# Patient Record
Sex: Female | Born: 1960 | Race: White | Hispanic: No | Marital: Married | Smoking: Never smoker
Health system: Southern US, Community
[De-identification: ages and names within clinical notes are randomized; demographics above are authoritative.]

## PROBLEM LIST (undated history)

## (undated) DIAGNOSIS — R42 Dizziness and giddiness: Secondary | ICD-10-CM

## (undated) DIAGNOSIS — G43909 Migraine, unspecified, not intractable, without status migrainosus: Secondary | ICD-10-CM

## (undated) HISTORY — DX: Migraine, unspecified, not intractable, without status migrainosus: G43.909

## (undated) HISTORY — DX: Dizziness and giddiness: R42

## (undated) HISTORY — PX: OTHER SURGICAL HISTORY: SHX169

---

## 1985-01-28 HISTORY — PX: ANTERIOR CRUCIATE LIGAMENT REPAIR: SHX115

## 1998-01-02 ENCOUNTER — Ambulatory Visit (HOSPITAL_COMMUNITY): Admission: RE | Admit: 1998-01-02 | Discharge: 1998-01-02 | Payer: Self-pay | Admitting: Obstetrics and Gynecology

## 1998-01-30 ENCOUNTER — Ambulatory Visit (HOSPITAL_COMMUNITY): Admission: RE | Admit: 1998-01-30 | Discharge: 1998-01-30 | Payer: Self-pay | Admitting: Obstetrics and Gynecology

## 1998-03-16 ENCOUNTER — Ambulatory Visit (HOSPITAL_COMMUNITY): Admission: RE | Admit: 1998-03-16 | Discharge: 1998-03-16 | Payer: Self-pay | Admitting: Obstetrics and Gynecology

## 1998-03-16 ENCOUNTER — Encounter: Payer: Self-pay | Admitting: Obstetrics and Gynecology

## 1998-04-06 ENCOUNTER — Inpatient Hospital Stay (HOSPITAL_COMMUNITY): Admission: AD | Admit: 1998-04-06 | Discharge: 1998-04-08 | Payer: Self-pay | Admitting: Obstetrics and Gynecology

## 1998-04-06 ENCOUNTER — Encounter: Payer: Self-pay | Admitting: Obstetrics and Gynecology

## 1998-11-01 ENCOUNTER — Other Ambulatory Visit: Admission: RE | Admit: 1998-11-01 | Discharge: 1998-11-01 | Payer: Self-pay | Admitting: Gynecology

## 1999-10-03 ENCOUNTER — Other Ambulatory Visit: Admission: RE | Admit: 1999-10-03 | Discharge: 1999-10-03 | Payer: Self-pay | Admitting: Obstetrics and Gynecology

## 2000-04-21 ENCOUNTER — Inpatient Hospital Stay (HOSPITAL_COMMUNITY): Admission: AD | Admit: 2000-04-21 | Discharge: 2000-04-22 | Payer: Self-pay | Admitting: Obstetrics and Gynecology

## 2000-11-14 ENCOUNTER — Other Ambulatory Visit: Admission: RE | Admit: 2000-11-14 | Discharge: 2000-11-14 | Payer: Self-pay | Admitting: Obstetrics and Gynecology

## 2000-12-19 ENCOUNTER — Emergency Department (HOSPITAL_COMMUNITY): Admission: EM | Admit: 2000-12-19 | Discharge: 2000-12-19 | Payer: Self-pay | Admitting: Emergency Medicine

## 2001-10-06 ENCOUNTER — Other Ambulatory Visit: Admission: RE | Admit: 2001-10-06 | Discharge: 2001-10-06 | Payer: Self-pay | Admitting: Obstetrics and Gynecology

## 2002-11-02 ENCOUNTER — Other Ambulatory Visit: Admission: RE | Admit: 2002-11-02 | Discharge: 2002-11-02 | Payer: Self-pay | Admitting: Obstetrics and Gynecology

## 2003-11-30 ENCOUNTER — Other Ambulatory Visit: Admission: RE | Admit: 2003-11-30 | Discharge: 2003-11-30 | Payer: Self-pay | Admitting: Obstetrics and Gynecology

## 2004-12-05 ENCOUNTER — Other Ambulatory Visit: Admission: RE | Admit: 2004-12-05 | Discharge: 2004-12-05 | Payer: Self-pay | Admitting: Obstetrics and Gynecology

## 2010-11-30 ENCOUNTER — Ambulatory Visit (INDEPENDENT_AMBULATORY_CARE_PROVIDER_SITE_OTHER): Payer: 59 | Admitting: Internal Medicine

## 2010-11-30 ENCOUNTER — Institutional Professional Consult (permissible substitution): Payer: Self-pay | Admitting: Internal Medicine

## 2010-11-30 ENCOUNTER — Encounter: Payer: Self-pay | Admitting: Internal Medicine

## 2010-11-30 DIAGNOSIS — J45990 Exercise induced bronchospasm: Secondary | ICD-10-CM

## 2010-11-30 DIAGNOSIS — R059 Cough, unspecified: Secondary | ICD-10-CM

## 2010-11-30 DIAGNOSIS — R05 Cough: Secondary | ICD-10-CM

## 2010-11-30 MED ORDER — TRAMADOL HCL 50 MG PO TABS: 50.0000 mg | ORAL_TABLET | ORAL | Status: AC | PRN

## 2010-11-30 MED ORDER — DOXYCYCLINE HYCLATE 100 MG PO TABS: 100.0000 mg | ORAL_TABLET | Freq: Two times a day (BID) | ORAL | Status: DC

## 2010-11-30 NOTE — Patient Instructions (Addendum)
Try prilosec 20mg   Take 30-60 min before first meal of the day and Pepcid 20 mg one bedtime until cough is completely gone for at least a week without the need for cough suppression  I think of reflux for chronic cough like I do oxygen for fire (doesn't cause the fire but once you get the oxygen suppressed it usually goes away regardless of the exact cause).   GERD (REFLUX)  is an extremely common cause of respiratory symptoms, many times with no significant heartburn at all.    It can be treated with medication, but also with lifestyle changes including avoidance of late meals, excessive alcohol, smoking cessation, and avoid fatty foods, chocolate, peppermint, colas, red wine, and acidic juices such as orange juice.  NO MINT OR MENTHOL PRODUCTS SO NO COUGH DROPS  USE SUGARLESS CANDY INSTEAD (jolley ranchers or Stover's)  NO OIL BASED VITAMINS - use powdered substitutes.   Take delsym two tsp every 12 hours and supplement if needed with  tramadol 50 mg up to 2 every 4 hours to suppress the urge to cough. Swallowing water or using ice chips/non mint and menthol containing candies (such as lifesavers or sugarless jolly ranchers) are also effective.  You should rest your voice and avoid activities that you know make you cough.  Once you have eliminated the cough for 3 straight days try reducing the tramadol first,  then the delsym as tolerated.    Stop flovent but finish up the prednisone   Take doxy x 7 days with large glass of water before eating   Only use proaire for emergencies for cough or sob   If you are satisfied with your treatment plan let your doctor know and he/she can either refill your medications or you can return here when your prescription runs out.     If in any way you are not 100% satisfied,  please tell us.  If 100% better, tell your friends!

## 2010-11-30 NOTE — Progress Notes (Signed)
  Subjective:    Patient ID: Monica Mcgee, female    DOB: [birth year], 50 y.o.   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 Father is md and started her on the keflex and tried her daughter's flovent but not helping   Cough / hoarseness worse at hs but also daytime assoc with minimal nasal congestion and no sob  Sleeping ok without nocturnal  or early am need for noct saba. Also denies any obvious fluctuation of symptoms with weather or environmental changes or other aggravating or alleviating factors except as outlined above    Review of Systems  Constitutional: Positive for fever. Negative for unexpected weight change.  HENT: Positive for sore throat and sneezing. Negative for ear pain, nosebleeds, congestion, rhinorrhea, trouble swallowing, dental problem, postnasal drip and sinus pressure.   Eyes: Negative for redness and itching.  Respiratory: Positive for cough. Negative for chest tightness, shortness of breath and wheezing.   Cardiovascular: Negative for palpitations and leg swelling.  Gastrointestinal: Negative for nausea, vomiting and diarrhea.  Genitourinary: Negative for dysuria.  Musculoskeletal: Negative for joint swelling.  Skin: Negative for rash.  Neurological: Negative for headaches.  Hematological: Does not bruise/bleed easily.  Psychiatric/Behavioral: Negative for dysphoric mood. The patient is not nervous/anxious.        Objective:   Physical Exam  11/30/2010 123  HEENT: nl dentition, turbinates, and orophanx. Nl external ear canals without cough reflex   NECK :   without JVD/Nodes/TM/ nl carotid upstrokes bilaterally   LUNGS: no acc muscle use, clear to A and P bilaterally without cough on insp or exp maneuvers   CV:  RRR  no s3 or murmur or increase in P2, no edema   ABD:  soft and nontender with nl excursion in the supine position. No bruits or organomegaly, bowel sounds nl  MS:  warm without deformities, calf tenderness, cyanosis or clubbing  SKIN: warm and dry without lesions    NEURO:  alert, approp, no deficits         Assessment & Plan:

## 2010-12-01 DIAGNOSIS — R059 Cough, unspecified: Secondary | ICD-10-CM | POA: Insufficient documentation

## 2010-12-01 DIAGNOSIS — R05 Cough: Secondary | ICD-10-CM | POA: Insufficient documentation

## 2010-12-01 DIAGNOSIS — J45990 Exercise induced bronchospasm: Secondary | ICD-10-CM | POA: Insufficient documentation

## 2010-12-01 NOTE — Assessment & Plan Note (Signed)
Very minimal problem and interestingly no active wheeze in setting of uri likely viral, can just use the saba prn for now

## 2010-12-01 NOTE — Assessment & Plan Note (Signed)
Explained natural history of uri and why it's necessary in patients at risk to treat GERD aggressively  at least  short term   to reduce risk of evolving cyclical cough initially  triggered by epithelial injury and a heightened sensitivty to the effects of any upper airway irritants,  most importantly acid - related.  That is, the more sensitive the epithelium damaged for virus, the more the cough, the more the secondary reflux (especially in those prone to reflux) the more the irritation of the sensitive mucosa and so on in a cyclical pattern.  See instructions for specific recommendations which were reviewed directly with the patient who was given a copy with highlighter outlining the key components.  

## 2010-12-12 ENCOUNTER — Telehealth: Payer: Self-pay | Admitting: Internal Medicine

## 2010-12-12 NOTE — Telephone Encounter (Signed)
Spoke with pt and notified of recs per MW. Pt verbalized understanding. OV with TP at 12 noon tomorrow.

## 2010-12-12 NOTE — Telephone Encounter (Signed)
Called and spoke with Dr. Inda Coke.  She was seen by Dr. Sherene Sires on 11/30/10.  States her cough got much better but "never went away."  States she took the doxycycline and finished it, finished pred taper, took prilosec, pepcid and delsym.  States she never felt like she needed to take the Tramadol because her cough had improved so well with just the above meds.  Pt has since stopped Prilosec and Pepcid because she said her cough had improved and she doesn't have heartburn/acid reflux.  And now pt states her cough is back, throat is sore, will occ cough up small amounts of discolored yellow to green sputum x 2 days.  Denies fever.   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  Pt is requesting MW's recs.  Please advise.

## 2010-12-12 NOTE — Telephone Encounter (Signed)
Needs to regroup in the office and remember the following  The standardized cough guidelines recently published in Chest by [place] in 2006  are a multiple step process (up to 12!) , not a single office visit,  and are intended  to address this problem logically,  with an alogrithm dependent on response to empiric treatment at  each progressive step  to determine a specific diagnosis with  minimal addtional testing needed. Therefore if compliance is an issue or can't be accurately verified then it's very unlikely the standard evaluation and treatment will be successful here.    Furthermore, response to therapy (other than acute cough suppression, which should only be used short term with avoidance of narcotic containing cough syrups if possible), can be a gradual process for which the patient may not receive immediate benefit.  Unlike going to an eye doctor where the right rx is almost always the first one and is immediately effective, this is almost never the case in the management of chronic cough syndromes and the patient needs to commit up front to compliance with recommendations and have the patience to wait out a response for up to 6 weeks of therapy directed at the likely underlying problem(s).

## 2010-12-13 ENCOUNTER — Ambulatory Visit (INDEPENDENT_AMBULATORY_CARE_PROVIDER_SITE_OTHER): Payer: 59 | Admitting: Adult Health

## 2010-12-13 ENCOUNTER — Other Ambulatory Visit: Payer: Self-pay | Admitting: Adult Health

## 2010-12-13 ENCOUNTER — Encounter: Payer: Self-pay | Admitting: Adult Health

## 2010-12-13 ENCOUNTER — Other Ambulatory Visit (INDEPENDENT_AMBULATORY_CARE_PROVIDER_SITE_OTHER): Payer: 59

## 2010-12-13 VITALS — BP 110/76 | HR 82 | Temp 98.5°F | Ht 63.0 in | Wt 120.4 lb

## 2010-12-13 DIAGNOSIS — R05 Cough: Secondary | ICD-10-CM

## 2010-12-13 DIAGNOSIS — R059 Cough, unspecified: Secondary | ICD-10-CM

## 2010-12-13 LAB — BETA STREP SCREEN: Streptococcus, Group A Screen (Direct): NEGATIVE

## 2010-12-13 NOTE — Assessment & Plan Note (Signed)
Slow to resolve URI w/ cough Will check for strep since recent family exposure  Plan:  Strep test today - I will call with results.  Salt water gargles Zyrtec 10mg  daily for 5 days  Delsym As needed  Cough  Fluids, rest and tylenol As needed   Please contact office for sooner follow up if symptoms do not improve or worsen or seek emergency care  follow up Dr. Sherene Sires  As planned and As needed

## 2010-12-13 NOTE — Patient Instructions (Addendum)
Strep test today - I will call with results.  Salt water gargles Zyrtec 10mg  daily for 5 days  Delsym As needed  Cough  Fluids, rest and tylenol As needed   Please contact office for sooner follow up if symptoms do not improve or worsen or seek emergency care  follow up Dr. Sherene Sires  As planned and As needed

## 2010-12-13 NOTE — Progress Notes (Signed)
  Subjective:    Patient ID: Monica Mcgee, female    DOB: 07-Jul-1960, 50 y.o.   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 Father is md and started her on the keflex and tried her daughter's flovent but not helping  Cough / hoarseness worse at hs but also daytime assoc with minimal nasal congestion and no sob >>changed to Doxycycline , finish steroid taper, and delsym/tramadol for cough   12/13/2010 Acute OV  Complains of sore throat, cough with congestion with clear to yellow mucus. Does feel some better but not back to her baseline. "feels she should be over this by now.". Son dx with Strep 2 days ago now on PCN.  Throat is very sore this am. Cough is some better , using some delsym. Did not use Tramadol.  Last visit, changed over to Doxycycline which she has now finished. Did not feel prednisone helped that much . No fever, discolored mucus or chest pain.    Review of Systems  Constitutional:   No  weight loss, night sweats,  Fevers, chills, fatigue, or  lassitude.  HEENT:   No headaches,  Difficulty swallowing,  Tooth/dental problems, or   ++Sore throat,                No sneezing, itching, ear ache,  ++nasal congestion, post nasal drip,   CV:  No chest pain,  Orthopnea, PND, swelling in lower extremities, anasarca, dizziness, palpitations, syncope.   GI  No heartburn, indigestion, abdominal pain, nausea, vomiting, diarrhea, change in bowel habits, loss of appetite, bloody stools.   Resp: No shortness of breath with exertion or at rest.  No coughing up of blood.  No change in color of  mucus.  No wheezing.  No chest wall deformity  Skin: no rash or lesions.  GU: no dysuria, change in color of urine, no urgency or frequency.  No flank pain, no hematuria   MS:  No joint pain or swelling.  No decreased range of motion.  No back pain.  Psych:  No change in mood or affect. No depression or anxiety.  No memory loss.          Objective:   Physical Exam  11/30/2010 123 >>,120 12/13/2010   HEENT: nl dentition, turbinates, and orophanx. Nl external ear canals without cough reflex Post pharynx red , no exudate    NECK :  without JVD/Nodes/TM/ nl carotid upstrokes bilaterally   LUNGS: no acc muscle use, clear to A and P bilaterally without cough on insp or exp maneuvers   CV:  RRR  no s3 or murmur or increase in P2, no edema   ABD:  soft and nontender with nl excursion in the supine position. No bruits or organomegaly, bowel sounds nl  MS:  warm without deformities, calf tenderness, cyanosis or clubbing  SKIN: warm and dry without lesions    NEURO:  alert, approp, no deficits         Assessment & Plan:

## 2010-12-14 ENCOUNTER — Telehealth: Payer: Self-pay | Admitting: Adult Health

## 2010-12-14 NOTE — Telephone Encounter (Signed)
I spoke with Monica Mcgee and she states the order for swab test was for group B and states this is normally done in pregnant women and wanted the okay to run group A. I spoke with TP and she states that was fine and was just entered in error. Nothing further was needed

## 2010-12-15 LAB — CULTURE, GROUP A STREP: Organism ID, Bacteria: NORMAL

## 2010-12-17 ENCOUNTER — Telehealth: Payer: Self-pay | Admitting: Adult Health

## 2010-12-17 NOTE — Telephone Encounter (Signed)
Neg strep cx.  Cont w/ ov recs I spoke with patient about results and she verbalized understanding and had no questions

## 2012-02-12 ENCOUNTER — Encounter: Payer: Self-pay | Admitting: Gastroenterology

## 2012-03-23 ENCOUNTER — Ambulatory Visit (AMBULATORY_SURGERY_CENTER): Payer: 59 | Admitting: *Deleted

## 2012-03-23 VITALS — Ht 63.0 in | Wt 120.0 lb

## 2012-03-23 DIAGNOSIS — Z1211 Encounter for screening for malignant neoplasm of colon: Secondary | ICD-10-CM

## 2012-03-23 MED ORDER — NA SULFATE-K SULFATE-MG SULF 17.5-3.13-1.6 GM/177ML PO SOLN: ORAL | Status: DC

## 2012-03-23 NOTE — Progress Notes (Signed)
When pt arrived to [facility], she states, "This is weird that I don't get to meet Dr. Christella Hartigan' today.  I really thought I was going to. Is that what it said in my letter?" Writer showed her the letter we sent her- that she would be meeting with the nurse the day of her PV to get her ready for her appt.  Writer states that pt can make an office appt to meet with Dr. Christella Hartigan before her procedure and she states she will think about it and call the office back if she decides to. Pt states, "I will need the first available appointment he has on a Monday."  I reviewed Dr. Christella Hartigan schedule and the first available he has for any Monday in March is 9:00.  Pt changed to 9:00 on 04-06-12 and new times corrected on her prep instructions When discussing prep, pt asks, "Why doesn't he just use Miralax?"  Writer explained that Suprep is the physician's prep of choice. After explaining prep instructions, pt states, "I will not be able to keep this appointment.  I didn't realize I would need someone to stay here with me and I will have to reschedule."  Writer explains I will need to know by the end of the day and pt just states to cancel her procedure and she will reschedule

## 2012-04-06 ENCOUNTER — Other Ambulatory Visit: Payer: 59 | Admitting: Gastroenterology

## 2012-07-21 ENCOUNTER — Other Ambulatory Visit: Payer: 59 | Admitting: Gastroenterology

## 2012-08-12 ENCOUNTER — Encounter: Payer: Self-pay | Admitting: Gastroenterology

## 2012-08-12 ENCOUNTER — Ambulatory Visit (AMBULATORY_SURGERY_CENTER): Payer: 59 | Admitting: Gastroenterology

## 2012-08-12 VITALS — BP 103/67 | HR 64 | Temp 98.7°F | Resp 42 | Ht 63.0 in | Wt 120.0 lb

## 2012-08-12 DIAGNOSIS — Z1211 Encounter for screening for malignant neoplasm of colon: Secondary | ICD-10-CM

## 2012-08-12 MED ORDER — SODIUM CHLORIDE 0.9 % IV SOLN: 500.0000 mL | INTRAVENOUS | Status: DC

## 2012-08-12 NOTE — Progress Notes (Signed)
Patient did not experience any of the following events: a burn prior to discharge; a fall within the facility; wrong site/side/patient/procedure/implant event; or a hospital transfer or hospital admission upon discharge from the facility. (G8907) Patient did not have preoperative order for IV antibiotic SSI prophylaxis. (G8918)  

## 2012-08-12 NOTE — Patient Instructions (Signed)
YOU HAD AN ENDOSCOPIC PROCEDURE TODAY AT THE [facility]: Refer to the procedure report that was given to you for any specific questions about what was found during the examination.  If the procedure report does not answer your questions, please call your gastroenterologist to clarify.  If you requested that your care partner not be given the details of your procedure findings, then the procedure report has been included in a sealed envelope for you to review at your convenience later.  YOU SHOULD EXPECT: Some feelings of bloating in the abdomen. Passage of more gas than usual.  Walking can help get rid of the air that was put into your GI tract during the procedure and reduce the bloating. If you had a lower endoscopy (such as a colonoscopy or flexible sigmoidoscopy) you may notice spotting of blood in your stool or on the toilet paper. If you underwent a bowel prep for your procedure, then you may not have a normal bowel movement for a few days.  DIET: Your first meal following the procedure should be a light meal and then it is ok to progress to your normal diet.  A half-sandwich or bowl of soup is an example of a good first meal.  Heavy or fried foods are harder to digest and may make you feel nauseous or bloated.  Likewise meals heavy in dairy and vegetables can cause extra gas to form and this can also increase the bloating.  Drink plenty of fluids but you should avoid alcoholic beverages for 24 hours.  ACTIVITY: Your care partner should take you home directly after the procedure.  You should plan to take it easy, moving slowly for the rest of the day.  You can resume normal activity the day after the procedure however you should NOT DRIVE or use heavy machinery for 24 hours (because of the sedation medicines used during the test).    SYMPTOMS TO REPORT IMMEDIATELY: A gastroenterologist can be reached at any hour.  During normal business hours, 8:30 AM to 5:00 PM Monday through Friday,  call [phone].  After hours and on weekends, please call the GI answering service at [phone] who will take a message and have the physician on call contact you.   Following lower endoscopy (colonoscopy or flexible sigmoidoscopy):  Excessive amounts of blood in the stool  Significant tenderness or worsening of abdominal pains  Swelling of the abdomen that is new, acute  Fever of 100F or higher   FOLLOW UP: If any biopsies were taken you will be contacted by phone or by letter within the next 1-3 weeks.  Call your gastroenterologist if you have not heard about the biopsies in 3 weeks.  Our staff will call the home number listed on your records the next business day following your procedure to check on you and address any questions or concerns that you may have at that time regarding the information given to you following your procedure. This is a courtesy call and so if there is no answer at the home number and we have not heard from you through the emergency physician on call, we will assume that you have returned to your regular daily activities without incident.  SIGNATURES/CONFIDENTIALITY: You and/or your care partner have signed paperwork which will be entered into your electronic medical record.  These signatures attest to the fact that that the information above on your After Visit Summary has been reviewed and is understood.  Full responsibility of the confidentiality of   this discharge information lies with you and/or your care-partner.  Information on hemorrhoids given to you today 

## 2012-08-12 NOTE — Op Note (Signed)
[facility] [address] [place], [address]   COLONOSCOPY PROCEDURE REPORT  PATIENT: Monica, Mcgee  MR#: [id number] BIRTHDATE: 09-18-1960 , 52  yrs. old GENDER: Female ENDOSCOPIST: Rachael Fee, MD REFERRED [id number] Vincente Poli, M.D. PROCEDURE DATE:  08/12/2012 PROCEDURE:   Colonoscopy, screening ASA CLASS:   Class II INDICATIONS:average risk screening. MEDICATIONS: Fentanyl 50 mcg IV, Versed 6 mg IV, and These medications were titrated to patient response per physician's verbal order  DESCRIPTION OF PROCEDURE:   After the risks benefits and alternatives of the procedure were thoroughly explained, informed consent was obtained.  A digital rectal exam revealed no abnormalities of the rectum.   The Pentax Ped Colon [id number] endoscope was introduced through the anus and advanced to the cecum, which was identified by both the appendix and ileocecal valve. No adverse events experienced.   The quality of the prep was good.  The instrument was then slowly withdrawn as the colon was fully examined.    COLON FINDINGS: A normal appearing cecum, ileocecal valve, and appendiceal orifice were identified.  The ascending, hepatic flexure, transverse, splenic flexure, descending, sigmoid colon and rectum appeared unremarkable.  No polyps or cancers were seen. Retroflexed views revealed no abnormalities. There were small external hemorrhoids.  The time to cecum=3 minutes 57 seconds. Withdrawal time=7 minutes 01 seconds.  The scope was withdrawn and the procedure completed. COMPLICATIONS: There were no complications.  ENDOSCOPIC IMPRESSION: Normal colon (no polyps or cancers) Small external hemorrhoids  RECOMMENDATIONS: You should continue to follow colorectal cancer screening guidelines for "routine risk" patients with a repeat colonoscopy in 10 years.    eSigned:  Rachael Fee, MD 08/12/2012 9:01 AM

## 2012-08-13 ENCOUNTER — Telehealth: Payer: Self-pay | Admitting: *Deleted

## 2012-08-13 NOTE — Telephone Encounter (Signed)
  Follow up Call-  Call back number 08/12/2012  Post procedure Call Back phone  # [phone]  Permission to leave phone message Yes     Patient questions:  Do you have a fever, pain , or abdominal swelling? no Pain Score  0 *  Have you tolerated food without any problems? yes  Have you been able to return to your normal activities? yes  Do you have any questions about your discharge instructions: Diet   no Medications  no Follow up visit  no  Do you have questions or concerns about your Care? no  Actions: * If pain score is 4 or above: No action needed, pain <4.

## 2013-03-03 ENCOUNTER — Other Ambulatory Visit: Payer: Self-pay | Admitting: Obstetrics and Gynecology

## 2013-03-03 DIAGNOSIS — R928 Other abnormal and inconclusive findings on diagnostic imaging of breast: Secondary | ICD-10-CM

## 2013-03-10 ENCOUNTER — Ambulatory Visit
Admission: RE | Admit: 2013-03-10 | Discharge: 2013-03-10 | Disposition: A | Discharge status: Home / Self Care | Payer: 59 | Source: Ambulatory Visit | Attending: Obstetrics and Gynecology | Admitting: Obstetrics and Gynecology

## 2013-03-10 DIAGNOSIS — R928 Other abnormal and inconclusive findings on diagnostic imaging of breast: Secondary | ICD-10-CM

## 2013-03-12 ENCOUNTER — Other Ambulatory Visit: Payer: 59

## 2015-01-30 MED FILL — MUPIROCIN 2% OINTMENT: 2 | 15 days supply | Qty: 22 | Fill #0

## 2015-02-28 DIAGNOSIS — Z01419 Encounter for gynecological examination (general) (routine) without abnormal findings: Secondary | ICD-10-CM | POA: Diagnosis not present

## 2015-02-28 DIAGNOSIS — Z6822 Body mass index (BMI) 22.0-22.9, adult: Secondary | ICD-10-CM | POA: Diagnosis not present

## 2015-02-28 DIAGNOSIS — Z1212 Encounter for screening for malignant neoplasm of rectum: Secondary | ICD-10-CM | POA: Diagnosis not present

## 2015-02-28 DIAGNOSIS — Z1231 Encounter for screening mammogram for malignant neoplasm of breast: Secondary | ICD-10-CM | POA: Diagnosis not present

## 2015-02-28 MED FILL — VENLAFAXINE HCL ER 37.5 MG: 37.5 | 30 days supply | Qty: 60 | Fill #0

## 2015-02-28 MED FILL — RELPAX 40 MG TABLET: 40 | 30 days supply | Qty: 9 | Fill #4

## 2015-03-24 DIAGNOSIS — H524 Presbyopia: Secondary | ICD-10-CM | POA: Diagnosis not present

## 2015-03-24 DIAGNOSIS — H2513 Age-related nuclear cataract, bilateral: Secondary | ICD-10-CM | POA: Diagnosis not present

## 2015-06-29 MED FILL — RELPAX 40 MG TABLET: 40 | 30 days supply | Qty: 9 | Fill #0

## 2015-09-03 DIAGNOSIS — R05 Cough: Secondary | ICD-10-CM | POA: Diagnosis not present

## 2015-09-03 DIAGNOSIS — R062 Wheezing: Secondary | ICD-10-CM | POA: Diagnosis not present

## 2015-09-03 DIAGNOSIS — R509 Fever, unspecified: Secondary | ICD-10-CM | POA: Diagnosis not present

## 2015-09-05 ENCOUNTER — Other Ambulatory Visit: Payer: Self-pay | Admitting: Pediatrics

## 2015-09-05 ENCOUNTER — Ambulatory Visit: Payer: 59

## 2015-09-05 DIAGNOSIS — R05 Cough: Secondary | ICD-10-CM

## 2015-09-05 DIAGNOSIS — R509 Fever, unspecified: Secondary | ICD-10-CM

## 2015-09-05 DIAGNOSIS — R059 Cough, unspecified: Secondary | ICD-10-CM

## 2015-10-19 DIAGNOSIS — R42 Dizziness and giddiness: Secondary | ICD-10-CM | POA: Diagnosis not present

## 2015-11-06 ENCOUNTER — Ambulatory Visit (HOSPITAL_COMMUNITY)
Admission: EM | Admit: 2015-11-06 | Discharge: 2015-11-06 | Disposition: A | Discharge status: Home / Self Care | Payer: 59 | Attending: Internal Medicine | Admitting: Internal Medicine

## 2015-11-06 ENCOUNTER — Encounter (HOSPITAL_COMMUNITY): Payer: Self-pay | Admitting: Family Medicine

## 2015-11-06 DIAGNOSIS — R509 Fever, unspecified: Secondary | ICD-10-CM | POA: Diagnosis not present

## 2015-11-06 MED ORDER — PREDNISONE 50 MG PO TABS: 50.0000 mg | ORAL_TABLET | Freq: Every day | ORAL | 0 refills | Status: DC

## 2015-11-06 MED ORDER — ALBUTEROL SULFATE HFA 108 (90 BASE) MCG/ACT IN AERS: 2.0000 | INHALATION_SPRAY | Freq: Four times a day (QID) | RESPIRATORY_TRACT | 0 refills | Status: AC | PRN

## 2015-11-06 MED ORDER — ACETAMINOPHEN 325 MG PO TABS
ORAL_TABLET | ORAL | Status: AC
  Filled 2015-11-06: qty 2

## 2015-11-06 MED ORDER — ACETAMINOPHEN 325 MG PO TABS
650.0000 mg | ORAL_TABLET | Freq: Once | ORAL | Status: AC
  Administered 2015-11-06: 650 mg via ORAL

## 2015-11-06 MED FILL — VENTOLIN HFA 90 MCG INHALER: 108 (90 BAS | 25 days supply | Qty: 18 | Fill #0

## 2015-11-06 MED FILL — ELETRIPTAN HBR 40 MG TABLET: 40 | 30 days supply | Qty: 9 | Fill #1

## 2015-11-06 MED FILL — predniSONE 50 MG TABS: 50 | 3 days supply | Qty: 3 | Fill #0

## 2015-11-06 NOTE — ED Triage Notes (Signed)
Pt here for cough, fever, chills. sts that she has been taking Advil for fever and pain. 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 sts flu shot last week.

## 2015-11-06 NOTE — Discharge Instructions (Addendum)
Recheck for persistent fever, increasing phlegm production, new malaise.  Prescriptions for prednisone and albuterol were sent to [facility].

## 2015-11-09 NOTE — ED Provider Notes (Addendum)
[facility]    CSN: [id number] Arrival date & time: 11/06/15  1154     History   Chief Complaint Chief Complaint  Patient presents with  . Fever  . 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  Fever and non productive cough for 3 days, had flu shot 10d ago.  Headache.  No runny nose, no sore throat.  Not achey x with coughing.    HPI  Past Medical History:  Diagnosis Date  . Migraines     Patient Active Problem List   Diagnosis Date Noted  . Cough 12/01/2010  . Asthma, exercise induced 12/01/2010    Past Surgical History:  Procedure Laterality Date  . Bath   right  . cone procedure     in her 20's    Home Medications    Prior to Admission medications   Medication Sig Start Date End Date Taking? Authorizing Provider  albuterol (PROVENTIL HFA;VENTOLIN HFA) 108 (90 Base) MCG/ACT inhaler Inhale 2 puffs into the lungs every 6 (six) hours as needed for wheezing or shortness of breath. 11/06/15   Sherlene Shams, MD  ibuprofen (ADVIL,MOTRIN) 200 MG tablet Take 200 mg by mouth every 6 (six) hours as needed.      Historical Provider, MD  predniSONE (DELTASONE) 50 MG tablet Take 1 tablet (50 mg total) by mouth daily. 11/06/15   Sherlene Shams, MD  rizatriptan (MAXALT) 10 MG tablet Take 10 mg by mouth as needed. May repeat in 2 hours if needed     Historical Provider, MD    Family History Family History  Problem Relation Age of Onset  . Colon cancer Neg Hx   . Esophageal cancer Neg Hx   . Stomach cancer Neg Hx   . Ulcerative colitis Neg Hx     Social History Social History  Substance Use Topics  . Smoking status: Never Smoker  . Smokeless tobacco: Never Used  . Alcohol use Yes     Comment: rare     Allergies   Review of patient's allergies indicates no known allergies.   Review of Systems Review of Systems  All other systems reviewed and are negative.    Physical Exam Triage  Vital Signs ED Triage Vitals [11/06/15 1244]  Enc Vitals Group     BP 123/80     Pulse Rate 87     Resp 18     Temp 102.9 F (39.4 C)     Temp src      SpO2 100 %     Weight      Height    Updated Vital Signs BP 123/80   Pulse 87   Temp 102.9 F (39.4 C)   Resp 18   LMP 10/30/2015   SpO2 100%  Physical Exam  Constitutional: She is oriented to person, place, and time. No distress.  Alert, nicely groomed Masked Looks tired but not toxic  HENT:  Head: Atraumatic.  B TMs a little dull, no erythema Mod nasal congestion Throat a little red  Eyes:  Conjugate gaze, no eye redness/drainage  Neck: Neck supple.  Cardiovascular: Normal rate and regular rhythm.   Pulmonary/Chest: No respiratory distress.  Coarse but symmetric breath sounds throughout  Abdominal: She exhibits no distension.  Musculoskeletal: Normal range of motion.  No leg swelling  Neurological: She is alert and oriented to person, place, and time.  Skin: Skin is warm  and dry.  No cyanosis Maybe slightly flushed (febrile)  Nursing note and vitals reviewed.    UC Treatments / Results   Procedures Procedures (including critical care time)      Non today  Medications Ordered in UC Medications  acetaminophen (TYLENOL) tablet 650 mg (650 mg Oral Given 11/06/15 1325)     Final Clinical Impressions(s) / UC Diagnoses   Final diagnoses:  Acute febrile illness   Recheck for persistent fever, increasing phlegm production, new malaise.  Prescriptions for prednisone and albuterol were sent to [facility].  Meds ordered this encounter  Medications  . predniSONE (DELTASONE) 50 MG tablet    Sig: Take 1 tablet (50 mg total) by mouth daily.    Dispense:  3 tablet    Refill:  0  . albuterol (PROVENTIL HFA;VENTOLIN HFA) 108 (90 Base) MCG/ACT inhaler    Sig: Inhale 2 puffs into the lungs every 6 (six) hours as needed for wheezing or shortness of breath.    Dispense:  1 Inhaler    Refill:   0         Sherlene Shams, MD 11/16/15 [phone]

## 2015-11-13 ENCOUNTER — Encounter: Payer: Self-pay | Admitting: Internal Medicine

## 2015-11-13 ENCOUNTER — Ambulatory Visit (INDEPENDENT_AMBULATORY_CARE_PROVIDER_SITE_OTHER): Payer: 59 | Admitting: Internal Medicine

## 2015-11-13 ENCOUNTER — Ambulatory Visit (INDEPENDENT_AMBULATORY_CARE_PROVIDER_SITE_OTHER)
Admission: RE | Admit: 2015-11-13 | Discharge: 2015-11-13 | Disposition: A | Discharge status: Home / Self Care | Payer: 59 | Source: Ambulatory Visit | Attending: Internal Medicine | Admitting: Internal Medicine

## 2015-11-13 ENCOUNTER — Telehealth: Payer: Self-pay | Admitting: Internal Medicine

## 2015-11-13 VITALS — BP 118/82 | HR 72 | Ht 63.0 in | Wt 120.4 lb

## 2015-11-13 DIAGNOSIS — R059 Cough, unspecified: Secondary | ICD-10-CM

## 2015-11-13 DIAGNOSIS — R05 Cough: Secondary | ICD-10-CM

## 2015-11-13 DIAGNOSIS — J181 Lobar pneumonia, unspecified organism: Secondary | ICD-10-CM | POA: Diagnosis not present

## 2015-11-13 DIAGNOSIS — J189 Pneumonia, unspecified organism: Secondary | ICD-10-CM

## 2015-11-13 MED ORDER — AZITHROMYCIN 250 MG PO TABS: ORAL_TABLET | ORAL | 0 refills | Status: DC

## 2015-11-13 MED FILL — AZITHROMYCIN 250 MG TABLET: 250 | 5 days supply | Qty: 6 | Fill #0

## 2015-11-13 NOTE — Progress Notes (Signed)
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 Pulmonary Consult    Self referral to re-establish care, Pt. c/o  persistant coughing, Pt denies chest pain and chest tightness, Pt. states that she doesn't feel like inhaler helps her  onset was x 10 days acute  cough/ fever no better with saba  11/06/15 UC > prednisone > fever gone / never sore throat or purulent sputum, cp or sinus complaints   No obvious  patterns in day to day or daytime variabilty or assoc sob or cp or chest tightness, subjective wheeze overt sinus or hb symptoms. No unusual exp hx or h/o childhood pna/ asthma or knowledge of premature birth.  Sleeping ok without nocturnal  or early am exacerbation  of respiratory  c/o's or need for noct saba. Also denies any obvious fluctuation of symptoms with weather or environmental changes or other aggravating or alleviating factors except as outlined above   Current Medications, Allergies, Complete Past Medical History, Past Surgical History, Family History, and Social History were reviewed in [employer] record.        Review of Systems  Constitutional: Negative.  Negative for fever and unexpected weight change.  HENT: Negative.  Negative for congestion, dental problem, ear pain, nosebleeds, postnasal drip, rhinorrhea, sinus pressure, sneezing, sore throat and trouble swallowing.   Eyes: Negative.  Negative for redness and itching.  Respiratory: Positive for cough. Negative for chest tightness, shortness of breath and wheezing.    Cardiovascular: Positive for palpitations. Negative for leg swelling.  Gastrointestinal: Negative.  Negative for nausea and vomiting.  Endocrine: Negative.   Genitourinary: Negative.  Negative for dysuria.  Musculoskeletal: Negative.  Negative for joint swelling.  Skin: Negative.  Negative for rash.  Allergic/Immunologic: Negative.   Neurological: Negative.  Negative for headaches.  Hematological: Negative.  Does not bruise/bleed easily.  Psychiatric/Behavioral: Negative.  Negative for dysphoric mood. The patient is not nervous/anxious.        Objective:   Physical Exam  amb wf nad  Wt Readings from Last 3 Encounters:  11/13/15 120 lb 6.4 oz (54.6 kg)  08/12/12 120 lb (54.4 kg)  03/23/12 120 lb (54.4 kg)    Vital signs reviewed     - Note on arrival 02 sats  99% on RA     HEENT: nl dentition, turbinates, and oropharynx. Nl external ear canals without cough reflex   NECK :  without JVD/Nodes/TM/ nl carotid upstrokes bilaterally   LUNGS: no acc muscle use,  Nl contour chest with insp pops/squeaks RLL no exp wheeze - pos cough on inspiration    CV:  RRR  no s3 or murmur or increase in P2, no edema   ABD:  soft and nontender with nl inspiratory excursion in the supine position. No bruits or organomegaly, bowel sounds nl  MS:  Nl gait/ ext warm without deformities, calf tenderness, cyanosis or clubbing No obvious joint restrictions   SKIN: warm and dry without lesions    NEURO:  alert, approp, nl sensorium  with  no motor deficits   CXR PA and Lateral:   11/13/2015 :    I personally reviewed images and agree with radiology impression as follows:    Right lower lobe infiltrate. Followup PA and lateral chest X-ray is recommended in 3-4 weeks following trial of antibiotic therapy to ensure resolution and exclude underlying malignancy.         Assessment & Plan:

## 2015-11-13 NOTE — Patient Instructions (Addendum)
Whenever you cough for any reason I recommend delsym 2 tsp every 12 hours as needed  Try prilosec otc 20mg   Take 30-60 min before first meal of the day and Pepcid ac (famotidine) 20 mg one @  bedtime until cough is completely gone for at least a week without the need for cough suppression  GERD (REFLUX)  is an extremely common cause of respiratory symptoms just like yours , many times with no obvious heartburn at all.    It can be treated with medication, but also with lifestyle changes including elevation of the head of your bed (ideally with 6 inch  bed blocks),  Smoking cessation, avoidance of late meals, excessive alcohol, and avoid fatty foods, chocolate, peppermint, colas, red wine, and acidic juices such as orange juice.  NO MINT OR MENTHOL PRODUCTS SO NO COUGH DROPS   USE SUGARLESS CANDY INSTEAD (Jolley ranchers or Stover's or Life Savers) or even ice chips will also do - the key is to swallow to prevent all throat clearing. NO OIL BASED VITAMINS - use powdered substitutes.  Please remember to go to the   x-ray department downstairs for your tests - we will call you with the results when they are available.    Follow up is as needed

## 2015-11-13 NOTE — Telephone Encounter (Signed)
Spoke with pt. She is has been scheduled to see MW today at 1:45pm. Nothing further was needed.

## 2015-11-14 ENCOUNTER — Encounter: Payer: Self-pay | Admitting: Internal Medicine

## 2015-11-14 DIAGNOSIS — J189 Pneumonia, unspecified organism: Secondary | ICD-10-CM | POA: Insufficient documentation

## 2015-11-14 NOTE — Assessment & Plan Note (Signed)
Tendency to recurrent cyclical cough  Of the three most common causes of chronic cough, only one (GERD)  can actually cause the other two (asthma and post nasal drip syndrome)  and perpetuate the cylce of cough inducing airway trauma, inflammation, heightened sensitivity to reflux which is prompted by the cough itself via a cyclical mechanism.    This may partially respond to steroids and look like asthma and post nasal drainage but never erradicated completely unless the cough and the secondary reflux are eliminated, preferably both at the same time.  While not intuitively obvious, many patients with chronic low grade reflux do not cough until there is a secondary insult that disturbs the protective epithelial barrier and exposes sensitive nerve endings.  This can be viral or assoc with pna(as may be the case here) or direct physical injury such as with an endotracheal tube.   The point is that once this occurs, it is difficult to eliminate using anything but a maximally effective acid suppression regimen at least in the short run, accompanied by an appropriate diet to address non acid GERD.   Reviewed max rx for gerd/ cyclical cough

## 2015-11-14 NOTE — Progress Notes (Signed)
Spoke with pt and notified of results per Dr. Wert. Pt verbalized understanding and denied any questions. 

## 2015-11-14 NOTE — Assessment & Plan Note (Addendum)
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 > rec zpak as relatively low risk resistant or virulent pathogen  but if not better omnicef or levaquin and f/u in 2 weeks   Discussed in detail all the  indications, usual  risks and alternatives  relative to the benefits with patient who agrees to proceed with conservative f/u as outlined    Total time devoted to counseling  = 325/40 m review case with pt/ discussion of options/alternatives/ personally creating written instructions  in presence of pt  then going over those specific  Instructions directly with the pt including how to use all of the meds but in particular covering each new medication in detail and the difference between the maintenance/automatic meds and the prns using an action plan format for the latter.

## 2015-11-15 ENCOUNTER — Encounter: Payer: Self-pay | Admitting: Internal Medicine

## 2015-11-15 ENCOUNTER — Telehealth: Payer: Self-pay | Admitting: Internal Medicine

## 2015-11-15 MED ORDER — CEFDINIR 300 MG PO CAPS: 300.0000 mg | ORAL_CAPSULE | Freq: Two times a day (BID) | ORAL | 0 refills | Status: DC

## 2015-11-15 MED ORDER — PREDNISONE 10 MG PO TABS: ORAL_TABLET | ORAL | 0 refills | Status: DC

## 2015-11-15 MED FILL — CEFDINIR 300 MG CAPSULE: 300 | 10 days supply | Qty: 20 | Fill #0

## 2015-11-15 NOTE — Telephone Encounter (Signed)
Patient states that she is not improving.  No fever, but cough is still lingering, not coughing up anything, but sounds like there is congestion in chest, Chest tightness, no SOB. On 2nd day of Zpak.  Wants to know if she should be getting any better?   No Known Allergies   Whenever you cough for any reason I recommend delsym 2 tsp every 12 hours as needed  Try prilosec otc 20mg   Take 30-60 min before first meal of the day and Pepcid ac (famotidine) 20 mg one @  bedtime until cough is completely gone for at least a week without the need for cough suppression  GERD (REFLUX)  is an extremely common cause of respiratory symptoms just like yours , many times with no obvious heartburn at all.    It can be treated with medication, but also with lifestyle changes including elevation of the head of your bed (ideally with 6 inch  bed blocks),  Smoking cessation, avoidance of late meals, excessive alcohol, and avoid fatty foods, chocolate, peppermint, colas, red wine, and acidic juices such as orange juice.  NO MINT OR MENTHOL PRODUCTS SO NO COUGH DROPS   USE SUGARLESS CANDY INSTEAD (Jolley ranchers or Stover's or Life Savers) or even ice chips will also do - the key is to swallow to prevent all throat clearing. NO OIL BASED VITAMINS - use powdered substitutes.  Please remember to go to the   x-ray department downstairs for your tests - we will call you with the results when they are available.    Follow up is as needed

## 2015-11-15 NOTE — Telephone Encounter (Signed)
Add omnicef 300 mg bid x 10 days but finish the zpak  (in absence of fever would hold off levaquin)  If not improving next step is Prednisone 10 mg take  4 each am x 2 days,   2 each am x 2 days,  1 each am x 2 days and stop

## 2015-11-15 NOTE — Telephone Encounter (Signed)
Dr Melvyn Novas    No improvement since I saw you Monday. I have taken 3 dose of z-pac, famotidine 20mg  Qhs, omeprazole 20 mg qam and guaifenesin 400 mg bid on Tuesday. I have gotten dizzy in the past when I took dextromethorphan (delsym) so I did not take it. I have not been coughing much with ice, candy and warm tea. Today I am feeling some chest tightness. I have woken the last two mornings with migraine headache that improved when I took relpax. No fever or problems breathing. I called and gave your clinic an update this afternoon. Thanks   ----------------- Patient aware of Dr. Gustavus Bryant recommendations.  Rx sent to pharmacy. Nothing further needed.

## 2015-11-17 ENCOUNTER — Telehealth: Payer: Self-pay | Admitting: Internal Medicine

## 2015-11-17 MED FILL — predniSONE 10 MG TABS: 10 | 6 days supply | Qty: 14 | Fill #0

## 2015-11-17 NOTE — Telephone Encounter (Signed)
Spoke with the pt and notified of recs per RB  She verbalized understanding  Nothing further needed 

## 2015-11-17 NOTE — Telephone Encounter (Signed)
Have her continue the antibiotic, go ahead and start the prednisone. Call us if she is not improving.

## 2015-11-17 NOTE — Telephone Encounter (Signed)
Called the work number and she is no longer there  Called her cell  She states that she had started to improve, and then today feels like she is going backwards  She is coughing up green sputum, but "I have not had fever and I feel the same"  We called in omnicef for her based on email from 11/15/15 and she has already started on this  She had PNA on cxr on 11/13/15  We gave pred rx to hold onto, but she has not started on this yet  She is requesting recs, please advise thanks!

## 2015-11-28 ENCOUNTER — Ambulatory Visit (INDEPENDENT_AMBULATORY_CARE_PROVIDER_SITE_OTHER): Payer: 59 | Admitting: Internal Medicine

## 2015-11-28 ENCOUNTER — Encounter: Payer: Self-pay | Admitting: Internal Medicine

## 2015-11-28 ENCOUNTER — Ambulatory Visit (INDEPENDENT_AMBULATORY_CARE_PROVIDER_SITE_OTHER)
Admission: RE | Admit: 2015-11-28 | Discharge: 2015-11-28 | Disposition: A | Discharge status: Home / Self Care | Payer: 59 | Source: Ambulatory Visit | Attending: Internal Medicine | Admitting: Internal Medicine

## 2015-11-28 VITALS — BP 100/66 | HR 83 | Ht 63.0 in | Wt 124.4 lb

## 2015-11-28 DIAGNOSIS — J181 Lobar pneumonia, unspecified organism: Secondary | ICD-10-CM

## 2015-11-28 DIAGNOSIS — R05 Cough: Secondary | ICD-10-CM

## 2015-11-28 DIAGNOSIS — J189 Pneumonia, unspecified organism: Secondary | ICD-10-CM

## 2015-11-28 DIAGNOSIS — R059 Cough, unspecified: Secondary | ICD-10-CM

## 2015-11-28 NOTE — Assessment & Plan Note (Signed)
-   cyclcal cough in 2012 cleared with resolution until recurred in setting of cap 10/2015   Cannot tolerate nl doses of ppi or even delsym or guaifenesin so for now just rx pepcid 20 mg bid and suppress urge to cough with hard rock candy > f/u is prn

## 2015-11-28 NOTE — Patient Instructions (Signed)
Try prilosec otc 20mg   Take 30-60 min before first meal of the day and Pepcid ac (famotidine) 20 mg one @  bedtime until cough is completely gone for at least a week without the need for cough suppression     If can't tolerate the prilosec the just do the pepcid 20 mg twice daily typically taken after meals until cough completely gone for at least a week   Should be better by mid November - call if needed

## 2015-11-28 NOTE — Assessment & Plan Note (Signed)
See cxr 11/13/2015 prob post basal segment on R  - zpak 11/13/15 omnicef 11/15/15 x 10  11/17/15 pred x 6 d - cxr 11/28/2015 cleared > f/u prn   Never smoker, no chronic resp complaints to suggest bronchiectasis or sinusitis so f/u is prn

## 2015-11-28 NOTE — Progress Notes (Signed)
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 Pulmonary Consult    Self referral to re-establish care, Pt. c/o  persistant coughing, Pt denies chest pain and chest tightness, Pt. states that she doesn't feel like inhaler helps her  onset was x 10 days acute  cough/ fever no better with saba  11/06/15 UC > prednisone > fever gone / never sore throat or purulent sputum, cp or sinus complaints  rec Whenever you cough for any reason I recommend delsym 2 tsp every 12 hours as needed Try prilosec otc 20mg   Take 30-60 min before first meal of the day and Pepcid ac (famotidine) 20 mg one @  bedtime until cough is completely gone for at least a week without the need for cough suppression Gerd diet    11/28/2015  f/u ov/Wert re: f/u cap  Chief Complaint  Patient presents with  . Follow-up    Pt. states her breathing is doing ok, still coughing alittle bit better,some chest tightness Pt. denies chest pain  zpak 11/13/15 omnicef 11/15/15 x 10  11/17/15 pred x 6 d No better with saba/ more day than noct cough and more dry than wet  No obvious day to day or daytime variability or assoc sob  or cp or chest tightness, subjective wheeze or overt sinus or hb symptoms. No unusual exp hx or h/o childhood pna/ asthma or knowledge of premature birth.  Sleeping ok without nocturnal  or early am exacerbation  of respiratory  c/o's or need for noct saba. Also denies any obvious fluctuation of symptoms with weather or environmental changes or other aggravating or alleviating  factors except as outlined above   Current Medications, Allergies, Complete Past Medical History, Past Surgical History, Family History, and Social History were reviewed in [employer] record.  ROS  The following are not active complaints unless bolded sore throat, dysphagia, dental problems, itching, sneezing,  nasal congestion or excess/ purulent secretions, ear ache,   fever, chills, sweats, unintended wt loss, classically pleuritic or exertional cp, hemoptysis,  orthopnea pnd or leg swelling, presyncope, palpitations, abdominal pain, anorexia, nausea, vomiting, diarrhea  or change in bowel or bladder habits, change in stools or urine, dysuria,hematuria,  rash, arthralgias, visual complaints, headache, numbness, weakness or ataxia or problems with walking or coordination,  change in mood/affect or memory.                 Objective:   Physical Exam  amb wf nad  11/28/2015     126   11/13/15 120 lb 6.4 oz (54.6 kg)  08/12/12 120 lb (54.4 kg)  03/23/12 120 lb (54.4 kg)    Vital signs reviewed     - Note on arrival 02 sats  97% on RA     HEENT: nl dentition, turbinates, and oropharynx. Nl external ear canals without cough reflex   NECK :  without JVD/Nodes/TM/ nl carotid upstrokes bilaterally   LUNGS: no acc muscle use,  Nl contour chest with no longer any  pops/squeaks in R base    CV:  RRR  no s3 or murmur or increase in P2, no edema   ABD:  soft and nontender with nl inspiratory excursion in the supine position. No bruits or organomegaly, bowel sounds nl  MS:  Nl gait/ ext warm without deformities, calf tenderness, cyanosis or clubbing No obvious joint restrictions   SKIN: warm and dry without lesions    NEURO:  alert, approp, nl sensorium with  no motor deficits     CXR PA and Lateral:   11/28/2015 :    I personally reviewed images and agree with radiology impression as follows:    The lungs are mildly hyperinflated with hemidiaphragm  flattening. Right lower lobe infiltrate has nearly totally resolved. The left lung is clear. The heart and pulmonary vascularity are normal. The mediastinum is normal in width. The observed bony thorax exhibits no acute abnormality.     Assessment & Plan:

## 2016-02-16 DIAGNOSIS — F411 Generalized anxiety disorder: Secondary | ICD-10-CM | POA: Diagnosis not present

## 2016-03-19 ENCOUNTER — Ambulatory Visit (INDEPENDENT_AMBULATORY_CARE_PROVIDER_SITE_OTHER): Payer: 59 | Admitting: Psychology

## 2016-03-19 DIAGNOSIS — F411 Generalized anxiety disorder: Secondary | ICD-10-CM | POA: Diagnosis not present

## 2016-03-26 ENCOUNTER — Ambulatory Visit (INDEPENDENT_AMBULATORY_CARE_PROVIDER_SITE_OTHER): Payer: 59 | Admitting: Psychology

## 2016-03-26 DIAGNOSIS — Z01419 Encounter for gynecological examination (general) (routine) without abnormal findings: Secondary | ICD-10-CM | POA: Diagnosis not present

## 2016-03-26 DIAGNOSIS — F411 Generalized anxiety disorder: Secondary | ICD-10-CM | POA: Diagnosis not present

## 2016-03-26 DIAGNOSIS — Z6822 Body mass index (BMI) 22.0-22.9, adult: Secondary | ICD-10-CM | POA: Diagnosis not present

## 2016-03-26 DIAGNOSIS — Z1231 Encounter for screening mammogram for malignant neoplasm of breast: Secondary | ICD-10-CM | POA: Diagnosis not present

## 2016-03-26 MED FILL — RIZATRIPTAN 5 MG TABLET: 5 | 90 days supply | Qty: 27 | Fill #0

## 2016-03-26 MED FILL — PROPRANOLOL 10 MG TABLET: 10 | 30 days supply | Qty: 30 | Fill #0

## 2016-03-27 DIAGNOSIS — Z13220 Encounter for screening for lipoid disorders: Secondary | ICD-10-CM | POA: Diagnosis not present

## 2016-03-27 DIAGNOSIS — E559 Vitamin D deficiency, unspecified: Secondary | ICD-10-CM | POA: Diagnosis not present

## 2016-03-27 DIAGNOSIS — Z131 Encounter for screening for diabetes mellitus: Secondary | ICD-10-CM | POA: Diagnosis not present

## 2016-03-27 DIAGNOSIS — Z130 Encounter for screening for diseases of the blood and blood-forming organs and certain disorders involving the immune mechanism: Secondary | ICD-10-CM | POA: Diagnosis not present

## 2016-03-27 DIAGNOSIS — Z1329 Encounter for screening for other suspected endocrine disorder: Secondary | ICD-10-CM | POA: Diagnosis not present

## 2016-03-27 DIAGNOSIS — Z13228 Encounter for screening for other metabolic disorders: Secondary | ICD-10-CM | POA: Diagnosis not present

## 2016-04-02 ENCOUNTER — Ambulatory Visit (INDEPENDENT_AMBULATORY_CARE_PROVIDER_SITE_OTHER): Payer: 59 | Admitting: Psychology

## 2016-04-02 DIAGNOSIS — F411 Generalized anxiety disorder: Secondary | ICD-10-CM | POA: Diagnosis not present

## 2016-04-09 ENCOUNTER — Ambulatory Visit (INDEPENDENT_AMBULATORY_CARE_PROVIDER_SITE_OTHER): Payer: 59 | Admitting: Psychology

## 2016-04-09 DIAGNOSIS — F411 Generalized anxiety disorder: Secondary | ICD-10-CM | POA: Diagnosis not present

## 2016-04-16 ENCOUNTER — Ambulatory Visit (INDEPENDENT_AMBULATORY_CARE_PROVIDER_SITE_OTHER): Payer: 59 | Admitting: Psychology

## 2016-04-16 DIAGNOSIS — F411 Generalized anxiety disorder: Secondary | ICD-10-CM | POA: Diagnosis not present

## 2016-04-23 ENCOUNTER — Ambulatory Visit (INDEPENDENT_AMBULATORY_CARE_PROVIDER_SITE_OTHER): Payer: 59 | Admitting: Psychology

## 2016-04-23 DIAGNOSIS — F411 Generalized anxiety disorder: Secondary | ICD-10-CM

## 2016-05-07 ENCOUNTER — Ambulatory Visit (INDEPENDENT_AMBULATORY_CARE_PROVIDER_SITE_OTHER): Payer: 59 | Admitting: Psychology

## 2016-05-07 DIAGNOSIS — F411 Generalized anxiety disorder: Secondary | ICD-10-CM

## 2016-05-10 MED FILL — PROPRANOLOL 10 MG TABLET: 10 | 30 days supply | Qty: 30 | Fill #1

## 2016-05-21 ENCOUNTER — Ambulatory Visit (INDEPENDENT_AMBULATORY_CARE_PROVIDER_SITE_OTHER): Payer: 59 | Admitting: Psychology

## 2016-05-21 DIAGNOSIS — F411 Generalized anxiety disorder: Secondary | ICD-10-CM | POA: Diagnosis not present

## 2016-05-28 ENCOUNTER — Ambulatory Visit: Payer: 59 | Admitting: Psychology

## 2016-06-17 MED FILL — PROPRANOLOL 10 MG TABLET: 10 | 90 days supply | Qty: 90 | Fill #2

## 2016-08-06 DIAGNOSIS — F4325 Adjustment disorder with mixed disturbance of emotions and conduct: Secondary | ICD-10-CM | POA: Diagnosis not present

## 2016-08-27 DIAGNOSIS — F4325 Adjustment disorder with mixed disturbance of emotions and conduct: Secondary | ICD-10-CM | POA: Diagnosis not present

## 2016-09-10 MED FILL — SHINGRIX 50 MCG SUS: 50 | 30 days supply | Qty: 1 | Fill #0

## 2016-09-10 MED FILL — PROPRANOLOL HCL 10 MG TAB: 10 | 90 days supply | Qty: 90 | Fill #3

## 2016-10-01 DIAGNOSIS — D1801 Hemangioma of skin and subcutaneous tissue: Secondary | ICD-10-CM | POA: Diagnosis not present

## 2016-10-01 DIAGNOSIS — D485 Neoplasm of uncertain behavior of skin: Secondary | ICD-10-CM | POA: Diagnosis not present

## 2016-10-01 DIAGNOSIS — L821 Other seborrheic keratosis: Secondary | ICD-10-CM | POA: Diagnosis not present

## 2016-10-01 DIAGNOSIS — L570 Actinic keratosis: Secondary | ICD-10-CM | POA: Diagnosis not present

## 2016-10-01 DIAGNOSIS — L438 Other lichen planus: Secondary | ICD-10-CM | POA: Diagnosis not present

## 2016-10-07 DIAGNOSIS — F4325 Adjustment disorder with mixed disturbance of emotions and conduct: Secondary | ICD-10-CM | POA: Diagnosis not present

## 2016-10-08 MED FILL — FLUOROURACIL 5% CREAM: 5 | 14 days supply | Qty: 40 | Fill #0

## 2016-11-27 MED FILL — SHINGRIX 50 MCG SUS: 50 | 30 days supply | Qty: 1 | Fill #1

## 2016-11-29 ENCOUNTER — Ambulatory Visit
Admission: RE | Admit: 2016-11-29 | Discharge: 2016-11-29 | Disposition: A | Discharge status: Home / Self Care | Payer: 59 | Source: Ambulatory Visit | Attending: Internal Medicine | Admitting: Internal Medicine

## 2016-11-29 ENCOUNTER — Other Ambulatory Visit: Payer: Self-pay | Admitting: Internal Medicine

## 2016-11-29 DIAGNOSIS — M25561 Pain in right knee: Secondary | ICD-10-CM

## 2016-11-29 DIAGNOSIS — G43829 Menstrual migraine, not intractable, without status migrainosus: Secondary | ICD-10-CM | POA: Diagnosis not present

## 2016-11-29 DIAGNOSIS — F419 Anxiety disorder, unspecified: Secondary | ICD-10-CM | POA: Diagnosis not present

## 2016-11-29 DIAGNOSIS — Z87898 Personal history of other specified conditions: Secondary | ICD-10-CM | POA: Diagnosis not present

## 2016-12-02 MED FILL — SERTRALINE HCL 25 MG TABLET: 25 | 30 days supply | Qty: 60 | Fill #0

## 2017-01-17 MED FILL — PROPRANOLOL HCL 10 MG TAB: 10 | 90 days supply | Qty: 90 | Fill #4

## 2017-01-28 DIAGNOSIS — F4325 Adjustment disorder with mixed disturbance of emotions and conduct: Secondary | ICD-10-CM | POA: Diagnosis not present

## 2017-01-31 DIAGNOSIS — R42 Dizziness and giddiness: Secondary | ICD-10-CM | POA: Diagnosis not present

## 2017-01-31 DIAGNOSIS — G43109 Migraine with aura, not intractable, without status migrainosus: Secondary | ICD-10-CM | POA: Diagnosis not present

## 2017-01-31 DIAGNOSIS — F419 Anxiety disorder, unspecified: Secondary | ICD-10-CM | POA: Diagnosis not present

## 2017-01-31 DIAGNOSIS — Z6379 Other stressful life events affecting family and household: Secondary | ICD-10-CM | POA: Diagnosis not present

## 2017-01-31 DIAGNOSIS — F4325 Adjustment disorder with mixed disturbance of emotions and conduct: Secondary | ICD-10-CM | POA: Diagnosis not present

## 2017-02-04 ENCOUNTER — Encounter: Payer: Self-pay | Admitting: Neurology

## 2017-02-11 DIAGNOSIS — F4325 Adjustment disorder with mixed disturbance of emotions and conduct: Secondary | ICD-10-CM | POA: Diagnosis not present

## 2017-02-14 DIAGNOSIS — H524 Presbyopia: Secondary | ICD-10-CM | POA: Diagnosis not present

## 2017-02-21 ENCOUNTER — Ambulatory Visit: Payer: 59 | Admitting: Neurology

## 2017-02-21 ENCOUNTER — Encounter: Payer: Self-pay | Admitting: Neurology

## 2017-02-21 VITALS — BP 108/76 | HR 70 | Ht 62.5 in | Wt 130.4 lb

## 2017-02-21 DIAGNOSIS — F4325 Adjustment disorder with mixed disturbance of emotions and conduct: Secondary | ICD-10-CM | POA: Diagnosis not present

## 2017-02-21 DIAGNOSIS — G43009 Migraine without aura, not intractable, without status migrainosus: Secondary | ICD-10-CM | POA: Diagnosis not present

## 2017-02-21 DIAGNOSIS — R42 Dizziness and giddiness: Secondary | ICD-10-CM

## 2017-02-21 MED ORDER — ELETRIPTAN HYDROBROMIDE 40 MG PO TABS: 40.0000 mg | ORAL_TABLET | ORAL | 3 refills | Status: AC | PRN

## 2017-02-21 NOTE — Progress Notes (Signed)
NEUROLOGY CONSULTATION NOTE  Monica Mcgee MRN: [id number] DOB: 31-Jul-1960  Referring provider: Dr. Coralyn Mark Primary care provider: Dr. Coralyn Mark  Reason for consult:  Migraine, vertigo  HISTORY OF PRESENT ILLNESS: Monica Mcgee is a 57 year old female with anxiety, chronic vertigo and exercise-induced asthma who presents for migraine and vertigo.  History supplemented by PCP note.  MIGRAINE: Onset:  Since teenager.  They got worse in her early-mid 60s. Location:  Unilateral but varies. Quality:  aching Intensity:  Moderate (occasionally severe) Aura:  no Prodrome:  no Postdrome:  no Associated symptoms:  Nausea, photophobia, phonophobia.  Occasionally dizziness.  No visual disturbance.  She has not had any new worse headache of her life, waking up from sleep Duration:  A couple of hours with triptan Frequency:  Twice a month Frequency of abortive medication: as needed above Triggers/exacerbating factors:  Lights, hormonal Relieving factors:  rest Activity:  aggravates  Past NSAIDS:  no Past analgesics:  no Past abortive triptans:  no Past muscle relaxants:  no Past anti-emetic:  no Past antihypertensive medications:  no Past antidepressant medications:  Effexor (side effects), sertraline Past anticonvulsant medications:  no Past vitamins/Herbal/Supplements:  no Past antihistamines/decongestants:  no Other past therapies:  no  Current NSAIDS:  ibuprofen Current analgesics:  no Current triptans:  Relpax 40mg , Maxalt (has on hand but typically does not use it.  Effective) Current anti-emetic:  no Current muscle relaxants:  no Current anti-anxiolytic:  no Current sleep aide:  no Current Antihypertensive medications:  propranolol 10mg  daily Current Antidepressant medications:  no Current Anticonvulsant medications:  no Current Vitamins/Herbal/Supplements:  Magnesium 400mg , riboflavin 400mg , Calcium 600mg , Omega 3, Fish Oil Current Antihistamines/Decongestants:   no Other therapy:  no  Caffeine:  1 cup coffee daily Alcohol:  rare Smoker:  no Diet:  hydrates Exercise:  yes Depression/anxiety:  anxiety Sleep hygiene:  ok Family history of headache:  daughters  VERTIGO: She has history of vertigo.  She had episodic positional vertigo about 3 times in past 8 years.  It was successfully treated with Epley maneuver.  In December, she got up in the middle of the night to go to the bathroom and while walking developed sudden onset spinning sensation.  She felt nauseous.  Movement exacerbated and triggered it.  Laying still on her back also aggravated it.  It resolved when she kept still while upright.  There was no associated double vision, headache, hearing loss, tinnitus, unilateral numbness or weakness.  She was unsteady on her feet.  She had no preceding viral illness.  It was severe for about 3 hours and then had a lingering positional vertigo for the next couple of weeks.  She saw her ENT who did not think it was BPPV because she did not exhibit nystagmus with Dix-Hallpike.  It has since resolved.  She has long standing history of sensitivity to dizziness.  For example, she used to often get car sick.  12/03/16 LABS:  CMP with Na 138, K4.5, Cl 102, CO2 29, glucose 86, BUN 15, Cr 0.68, t bili 0.9, ALP 48, AST 21 and ALT 18; CBC with WBC 4, HGB 14, HCT 40.8 and PLT 263; TSH 1.46  PAST MEDICAL HISTORY: Past Medical History:  Diagnosis Date  . Migraines   . Vertigo    Benign vestibular    PAST SURGICAL HISTORY: Past Surgical History:  Procedure Laterality Date  . Watson   right  . cone procedure  in her 20's    MEDICATIONS: Current Outpatient Medications on File Prior to Visit  Medication Sig Dispense Refill  . aspirin EC 81 MG tablet Take 81 mg by mouth daily.    . calcium gluconate 500 MG tablet Take 1 tablet by mouth daily.    . magnesium oxide (MAG-OX) 400 MG tablet Take 400 mg by mouth daily.    .  Omega-3 Fatty Acids (OMEGA-3 EPA FISH OIL PO) Take 1 capsule by mouth daily.    Marland Kitchen OVER THE COUNTER MEDICATION     . Riboflavin 400 MG TABS Take 400 mg by mouth daily.    Marland Kitchen albuterol (PROVENTIL HFA;VENTOLIN HFA) 108 (90 Base) MCG/ACT inhaler Inhale 2 puffs into the lungs every 6 (six) hours as needed for wheezing or shortness of breath. (Patient not taking: Reported on 11/28/2015) 1 Inhaler 0  . ibuprofen (ADVIL,MOTRIN) 200 MG tablet Take 200 mg by mouth every 6 (six) hours as needed.      . propranolol (INDERAL) 10 MG tablet Take 10 mg by mouth daily.  12  . rizatriptan (MAXALT) 10 MG tablet Take 10 mg by mouth as needed. May repeat in 2 hours if needed      No current facility-administered medications on file prior to visit.     ALLERGIES: No Known Allergies  FAMILY HISTORY: Family History  Problem Relation Age of Onset  . Migraines Mother   . Thyroid disease Mother   . Macular degeneration Father   . Migraines Sister   . Migraines Brother   . Colon cancer Neg Hx   . Esophageal cancer Neg Hx   . Stomach cancer Neg Hx   . Ulcerative colitis Neg Hx     SOCIAL HISTORY: Social History   Socioeconomic History  . Marital status: Married    Spouse name: Not on file  . Number of children: 3  . Years of education: Not on file  . Highest education level: Professional school degree (e.g., MD, DDS, DVM, JD)  Social Needs  . Financial resource strain: Not on file  . Food insecurity - worry: Not on file  . Food insecurity - inability: Not on file  . Transportation needs - medical: Not on file  . Transportation needs - non-medical: Not on file  Occupational History  . Occupation: Pediatrian  Tobacco Use  . Smoking status: Never Smoker  . Smokeless tobacco: Never Used  Substance and Sexual Activity  . Alcohol use: Yes    Comment: rare  . Drug use: No  . Sexual activity: Yes    Birth control/protection: None  Other Topics Concern  . Not on file  Social History Narrative    Married withe 3 children. Lives in a 1 story house. Drinks 1 cup of coffee a day. Walks 2-3 miles, 2-3 x week. Patient is a developmental pediatrician.    REVIEW OF SYSTEMS: Constitutional: No fevers, chills, or sweats, no generalized fatigue, change in appetite Eyes: No visual changes, double vision, eye pain Ear, nose and throat: No hearing loss, ear pain, nasal congestion, sore throat Cardiovascular: No chest pain, palpitations Respiratory:  No shortness of breath at rest or with exertion, wheezes GastrointestinaI: No nausea, vomiting, diarrhea, abdominal pain, fecal incontinence Genitourinary:  No dysuria, urinary retention or frequency Musculoskeletal:  No neck pain, back pain Integumentary: No rash, pruritus, skin lesions Neurological: as above Psychiatric: No depression, insomnia, anxiety Endocrine: No palpitations, fatigue, diaphoresis, mood swings, change in appetite, change in weight, increased thirst Hematologic/Lymphatic:  No purpura,  petechiae. Allergic/Immunologic: no itchy/runny eyes, nasal congestion, recent allergic reactions, rashes  PHYSICAL EXAM: Vitals:   02/21/17 0812  BP: 108/76  Pulse: 70  SpO2: 99%   General: No acute distress.  Patient appears well-groomed.   Head:  Normocephalic/atraumatic Eyes:  fundi examined but not visualized Neck: supple, no paraspinal tenderness, full range of motion Back: No paraspinal tenderness Heart: regular rate and rhythm Lungs: Clear to auscultation bilaterally. Vascular: No carotid bruits. Neurological Exam: Mental status: alert and oriented to person, place, and time, recent and remote memory intact, fund of knowledge intact, attention and concentration intact, speech fluent and not dysarthric, language intact. Cranial nerves: CN I: not tested CN II: pupils equal, round and reactive to light, visual fields intact CN III, IV, VI:  full range of motion, no nystagmus, no ptosis CN V: facial sensation intact CN VII: upper  and lower face symmetric CN VIII: hearing intact CN IX, X: gag intact, uvula midline CN XI: sternocleidomastoid and trapezius muscles intact CN XII: tongue midline Bulk & Tone: normal, no fasciculations. Motor:  5/5 throughout  Sensation: temperature and vibration sensation intact. Deep Tendon Reflexes:  2+ throughout, toes downgoing.  Finger to nose testing:  Without dysmetria.  Heel to shin:  Without dysmetria.   Gait:  Normal station and stride.  Able to turn and tandem walk. Romberg negative.  IMPRESSION: 1.  Migraine 2.  Vertigo.  History of BPPV.  She had an event last month with severe acute onset vertigo lasting several hours and with residual lingering dizziness for next week or two.  No concerning lateralizing symptoms.  Consider vestibular migraine with residual dizziness lasting several days, migrainous vertigo.  Severe case of BPPV still possible.  I don't think it was vestibular neuritis as severe symptoms would have lasted longer.  I don't suspect stroke or vertebrobasilar insufficiency due to her age, no risk factors and she did not exhibit concerning lateralizing symptoms.  PLAN: 1.  She should continue Relpax as needed/directed for acute migraines. 2.  Her neurologic exam is normal.  I don't think imaging is needed at this time.  However, if she should have a recurrent spell of severe vertigo, we can order MRI/MRA of head to evaluate vertebrobasilar system.  Thank you for allowing me to take part in the care of this patient.  Metta Clines, DO  CC:  Emi Belfast, MD

## 2017-02-21 NOTE — Patient Instructions (Signed)
Vertigo could have been migraine related or still benign positional vertigo.  However, I do not suspect stroke or brain tumor.  If you should have another episode, contact me and we can order imaging.  At this time, I don't think it is warranted.  Take Relpax 40mg  earliest onset of migraine.  May repeat once after 2 hours if needed.  Do not exceed 2 tablets in 24 hours. Take magnesium 400mg  daily, riboflavin 400mg  daily.  Consider coenzyme Q10 100mg  three times daily.

## 2017-02-27 NOTE — Progress Notes (Addendum)
Rcvd PA request for eletriptan from [facility], submitted on cover my meds [id number]

## 2017-03-04 NOTE — Progress Notes (Signed)
Rcvd approval for eletriptan 40 mg for 12 fills 02/21/17-02/20/18. Called [facility], LM on VM advising med apprvd

## 2017-03-07 DIAGNOSIS — F4325 Adjustment disorder with mixed disturbance of emotions and conduct: Secondary | ICD-10-CM | POA: Diagnosis not present

## 2017-03-18 DIAGNOSIS — F4325 Adjustment disorder with mixed disturbance of emotions and conduct: Secondary | ICD-10-CM | POA: Diagnosis not present

## 2017-03-21 MED FILL — ELETRIPTAN HBR 40 MG TABLET: 40 | 30 days supply | Qty: 6 | Fill #0

## 2017-03-25 DIAGNOSIS — Z1231 Encounter for screening mammogram for malignant neoplasm of breast: Secondary | ICD-10-CM | POA: Diagnosis not present

## 2017-03-25 DIAGNOSIS — Z01419 Encounter for gynecological examination (general) (routine) without abnormal findings: Secondary | ICD-10-CM | POA: Diagnosis not present

## 2017-03-25 DIAGNOSIS — Z6823 Body mass index (BMI) 23.0-23.9, adult: Secondary | ICD-10-CM | POA: Diagnosis not present

## 2017-04-01 DIAGNOSIS — F4325 Adjustment disorder with mixed disturbance of emotions and conduct: Secondary | ICD-10-CM | POA: Diagnosis not present

## 2017-04-08 DIAGNOSIS — D2271 Melanocytic nevi of right lower limb, including hip: Secondary | ICD-10-CM | POA: Diagnosis not present

## 2017-04-08 DIAGNOSIS — L821 Other seborrheic keratosis: Secondary | ICD-10-CM | POA: Diagnosis not present

## 2017-04-08 DIAGNOSIS — D2272 Melanocytic nevi of left lower limb, including hip: Secondary | ICD-10-CM | POA: Diagnosis not present

## 2017-04-08 DIAGNOSIS — D235 Other benign neoplasm of skin of trunk: Secondary | ICD-10-CM | POA: Diagnosis not present

## 2017-04-08 DIAGNOSIS — D1801 Hemangioma of skin and subcutaneous tissue: Secondary | ICD-10-CM | POA: Diagnosis not present

## 2017-04-15 DIAGNOSIS — Z13820 Encounter for screening for osteoporosis: Secondary | ICD-10-CM | POA: Diagnosis not present

## 2017-04-22 DIAGNOSIS — F4325 Adjustment disorder with mixed disturbance of emotions and conduct: Secondary | ICD-10-CM | POA: Diagnosis not present

## 2017-04-25 MED FILL — PROPRANOLOL 10 MG TABLET: 10 | 30 days supply | Qty: 30 | Fill #0

## 2017-04-29 DIAGNOSIS — H8112 Benign paroxysmal vertigo, left ear: Secondary | ICD-10-CM | POA: Diagnosis not present

## 2017-05-13 DIAGNOSIS — F4325 Adjustment disorder with mixed disturbance of emotions and conduct: Secondary | ICD-10-CM | POA: Diagnosis not present

## 2017-05-27 MED FILL — PROPRANOLOL 10 MG TABLET: 10 | 90 days supply | Qty: 90 | Fill #1

## 2017-06-10 DIAGNOSIS — R5383 Other fatigue: Secondary | ICD-10-CM | POA: Diagnosis not present

## 2017-06-10 DIAGNOSIS — J45990 Exercise induced bronchospasm: Secondary | ICD-10-CM | POA: Diagnosis not present

## 2017-06-10 DIAGNOSIS — R42 Dizziness and giddiness: Secondary | ICD-10-CM | POA: Diagnosis not present

## 2017-06-10 DIAGNOSIS — G43109 Migraine with aura, not intractable, without status migrainosus: Secondary | ICD-10-CM | POA: Diagnosis not present

## 2017-06-10 DIAGNOSIS — Z0000 Encounter for general adult medical examination without abnormal findings: Secondary | ICD-10-CM | POA: Diagnosis not present

## 2017-06-10 DIAGNOSIS — Z1211 Encounter for screening for malignant neoplasm of colon: Secondary | ICD-10-CM | POA: Diagnosis not present

## 2017-07-10 ENCOUNTER — Telehealth: Payer: Self-pay | Admitting: Neurology

## 2017-07-10 NOTE — Telephone Encounter (Signed)
Either one can be used.  The only thing is that with butterbur, her liver function would need to be monitored because there is potential for liver toxicity.  So if she is interested in such a supplement, then I would go with feverfew for now.  Doses from 50mg  to 300mg  daily have been shown to possibly be helpful.  I would start with 50mg  daily for now.

## 2017-07-10 NOTE — Telephone Encounter (Signed)
Called and spoke with Pt, advised her of Dr Georgie Chard recommendations

## 2017-08-05 DIAGNOSIS — F4325 Adjustment disorder with mixed disturbance of emotions and conduct: Secondary | ICD-10-CM | POA: Diagnosis not present

## 2017-08-12 DIAGNOSIS — F4325 Adjustment disorder with mixed disturbance of emotions and conduct: Secondary | ICD-10-CM | POA: Diagnosis not present

## 2017-08-19 DIAGNOSIS — F4325 Adjustment disorder with mixed disturbance of emotions and conduct: Secondary | ICD-10-CM | POA: Diagnosis not present

## 2017-08-26 DIAGNOSIS — F4325 Adjustment disorder with mixed disturbance of emotions and conduct: Secondary | ICD-10-CM | POA: Diagnosis not present

## 2017-09-02 IMAGING — DX DG CHEST 2V
2 series · 2 of 2 positions shown · non-contrast
Comparison: None.

CLINICAL DATA: Cough for 10 days

EXAM:
CHEST  2 VIEW

[chest pa]
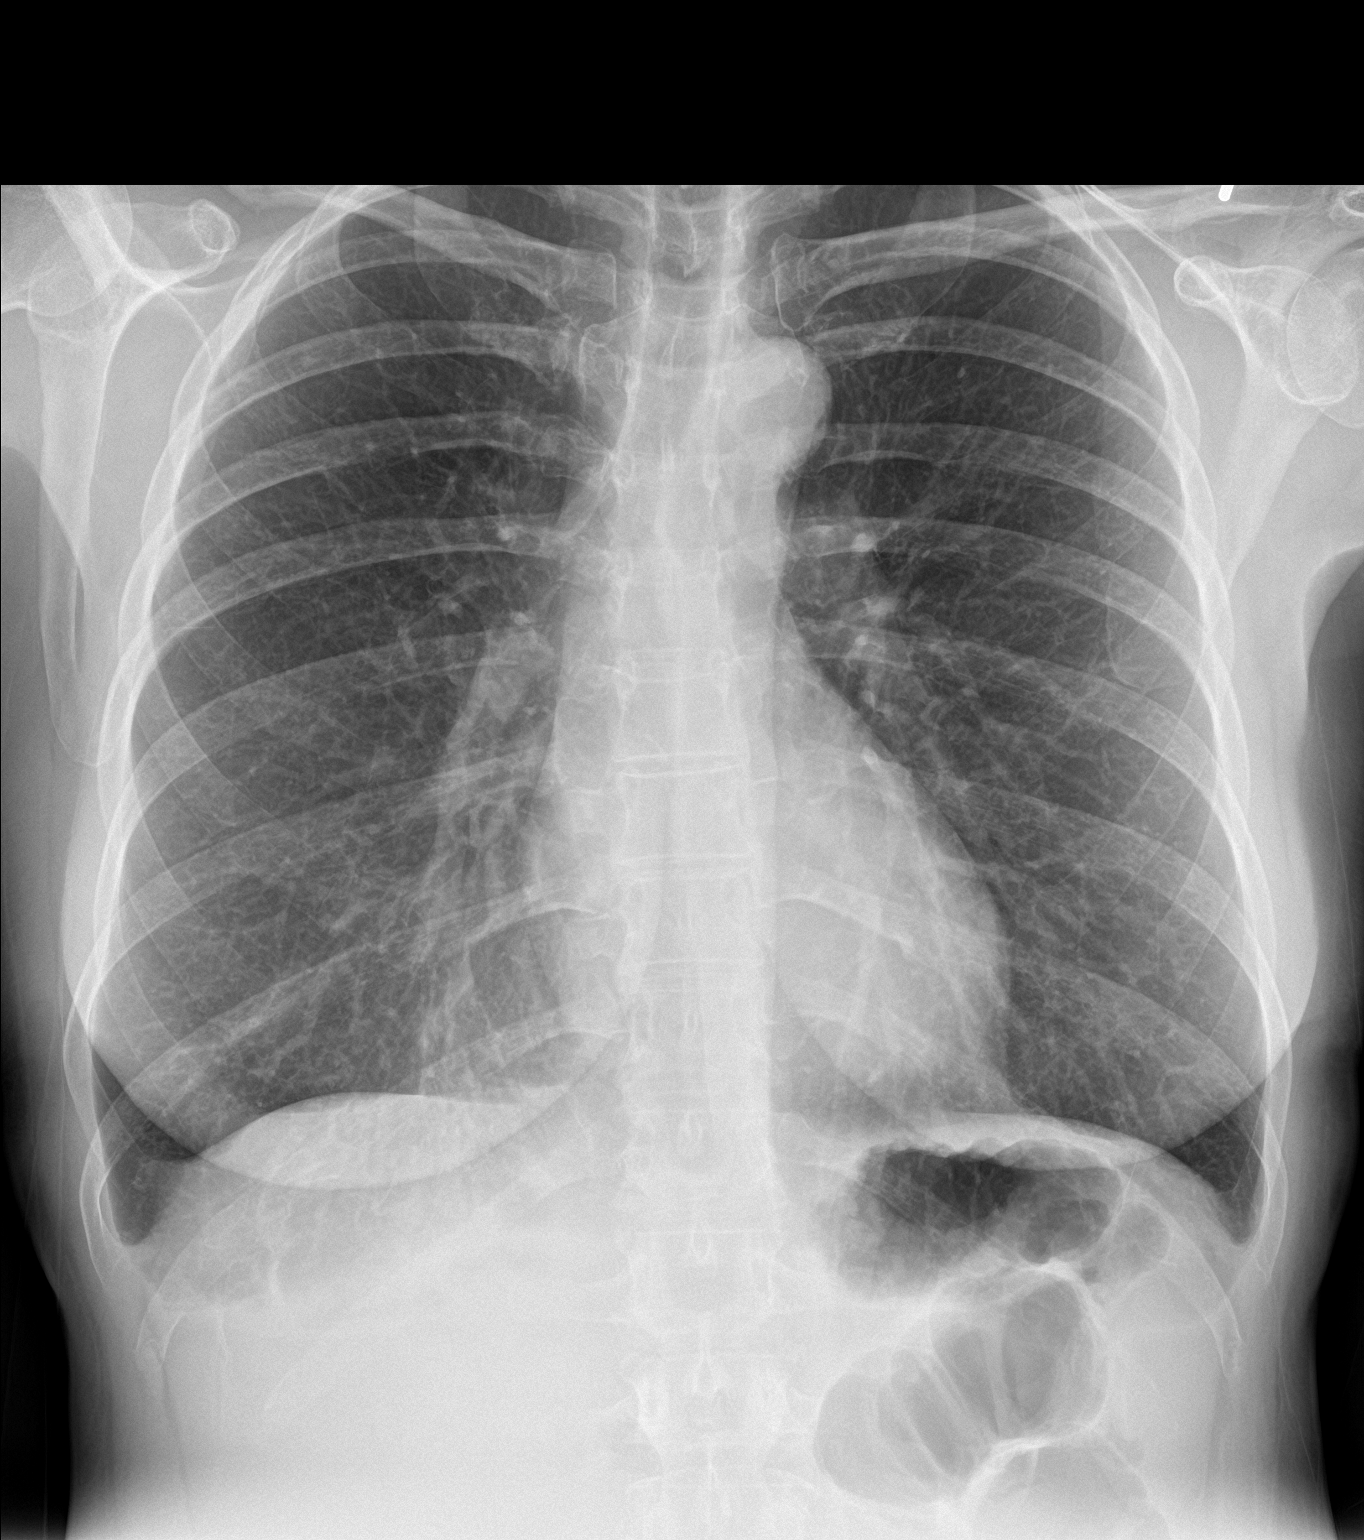

[chest lat]
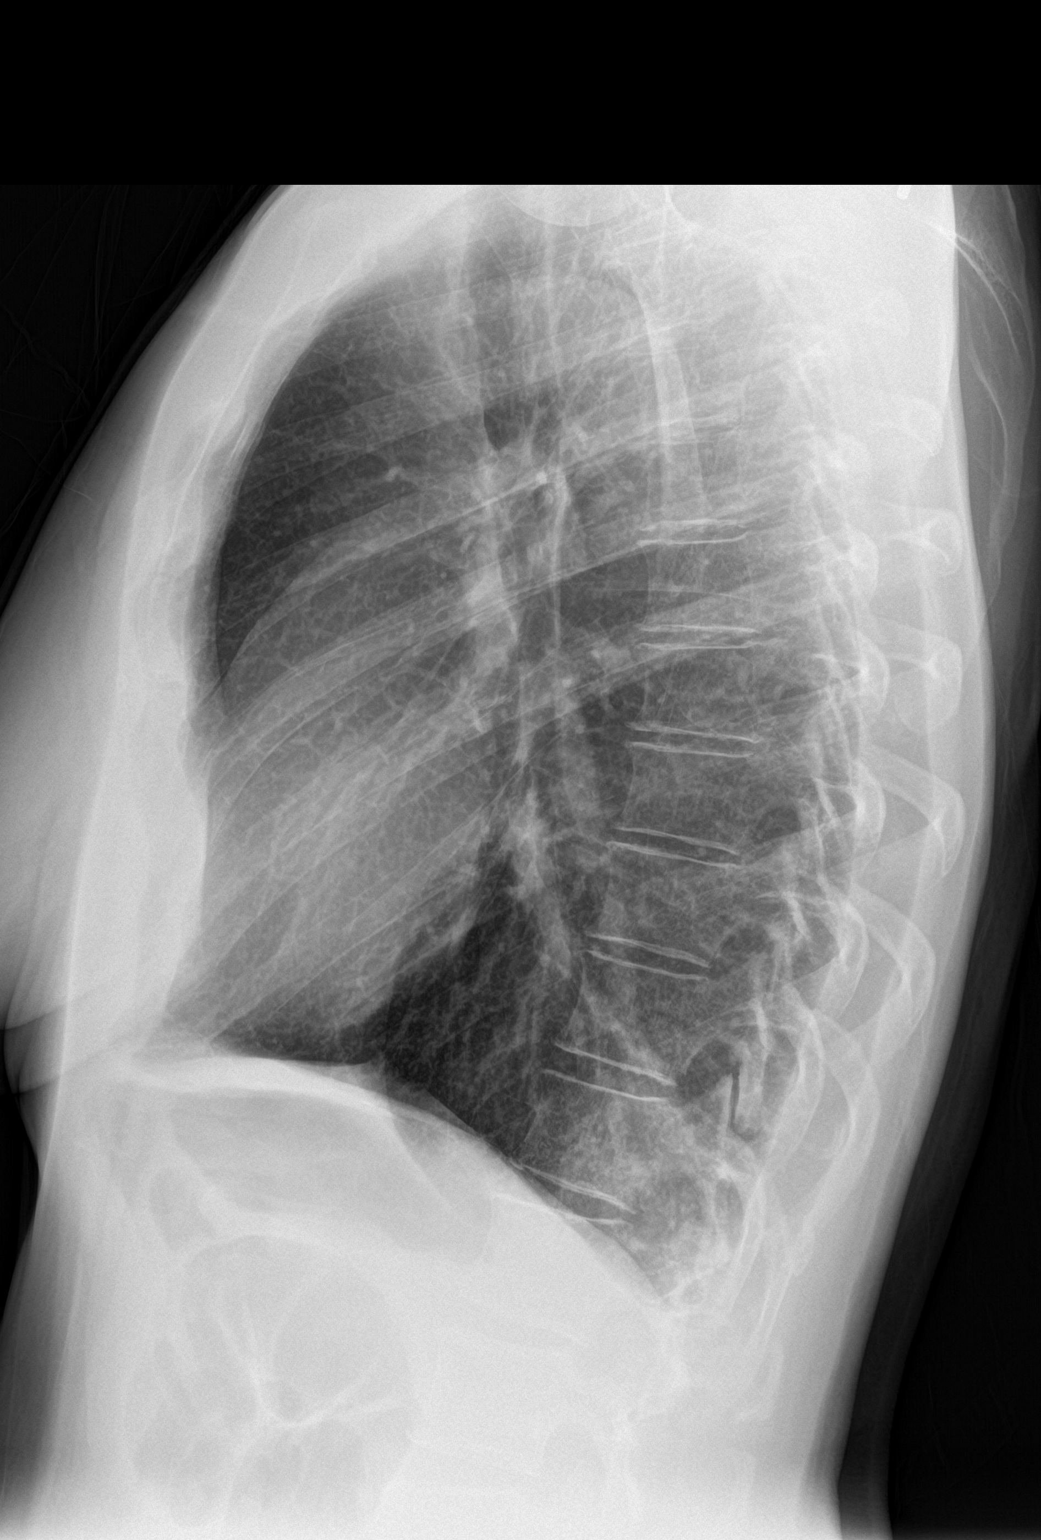

[2 of 2 positions shown; findings below may reference images not displayed]

FINDINGS: Cardiac shadow is within normal limits. The lungs are well aerated
bilaterally. Increased density is noted projecting in the posterior
costophrenic angle likely in the right lower lobe medially. This is
consistent with focal infiltrate. No bony abnormality is seen.
IMPRESSION: Right lower lobe infiltrate. Followup PA and lateral chest X-ray is
recommended in 3-4 weeks following trial of antibiotic therapy to
ensure resolution and exclude underlying malignancy.

## 2017-09-04 MED FILL — ELETRIPTAN HBR 40 MG TABLET: 40 | 30 days supply | Qty: 6 | Fill #1

## 2017-09-09 ENCOUNTER — Ambulatory Visit
Admission: RE | Admit: 2017-09-09 | Discharge: 2017-09-09 | Disposition: A | Discharge status: Home / Self Care | Payer: 59 | Source: Ambulatory Visit | Attending: Internal Medicine | Admitting: Internal Medicine

## 2017-09-09 ENCOUNTER — Other Ambulatory Visit: Payer: Self-pay | Admitting: Internal Medicine

## 2017-09-09 DIAGNOSIS — R0789 Other chest pain: Secondary | ICD-10-CM

## 2017-09-09 DIAGNOSIS — F4325 Adjustment disorder with mixed disturbance of emotions and conduct: Secondary | ICD-10-CM | POA: Diagnosis not present

## 2017-09-09 DIAGNOSIS — J45990 Exercise induced bronchospasm: Secondary | ICD-10-CM | POA: Diagnosis not present

## 2017-09-09 MED FILL — VENTOLIN HFA 90 MCG INHALER: 108 (90 BAS | 25 days supply | Qty: 18 | Fill #0

## 2017-09-12 ENCOUNTER — Ambulatory Visit (INDEPENDENT_AMBULATORY_CARE_PROVIDER_SITE_OTHER): Payer: 59

## 2017-09-12 ENCOUNTER — Other Ambulatory Visit: Payer: Self-pay | Admitting: Internal Medicine

## 2017-09-12 DIAGNOSIS — R0789 Other chest pain: Secondary | ICD-10-CM

## 2017-09-12 DIAGNOSIS — F4325 Adjustment disorder with mixed disturbance of emotions and conduct: Secondary | ICD-10-CM | POA: Diagnosis not present

## 2017-09-12 LAB — EXERCISE TOLERANCE TEST
Estimated workload: 10.9 METS
Exercise duration (min): 9 min
Exercise duration (sec): 30 s
MPHR: 165 {beats}/min
Peak HR: 166 {beats}/min
Percent HR: 100 %
RPE: 18
Rest HR: 71 {beats}/min

## 2017-09-16 DIAGNOSIS — F4325 Adjustment disorder with mixed disturbance of emotions and conduct: Secondary | ICD-10-CM | POA: Diagnosis not present

## 2017-09-23 DIAGNOSIS — J45990 Exercise induced bronchospasm: Secondary | ICD-10-CM | POA: Diagnosis not present

## 2017-09-23 DIAGNOSIS — F4325 Adjustment disorder with mixed disturbance of emotions and conduct: Secondary | ICD-10-CM | POA: Diagnosis not present

## 2017-09-23 DIAGNOSIS — R0789 Other chest pain: Secondary | ICD-10-CM | POA: Diagnosis not present

## 2017-09-27 DIAGNOSIS — F4325 Adjustment disorder with mixed disturbance of emotions and conduct: Secondary | ICD-10-CM | POA: Diagnosis not present

## 2017-09-30 DIAGNOSIS — F4325 Adjustment disorder with mixed disturbance of emotions and conduct: Secondary | ICD-10-CM | POA: Diagnosis not present

## 2017-10-07 DIAGNOSIS — D235 Other benign neoplasm of skin of trunk: Secondary | ICD-10-CM | POA: Diagnosis not present

## 2017-10-07 DIAGNOSIS — D1801 Hemangioma of skin and subcutaneous tissue: Secondary | ICD-10-CM | POA: Diagnosis not present

## 2017-10-07 DIAGNOSIS — L821 Other seborrheic keratosis: Secondary | ICD-10-CM | POA: Diagnosis not present

## 2017-10-14 DIAGNOSIS — F4325 Adjustment disorder with mixed disturbance of emotions and conduct: Secondary | ICD-10-CM | POA: Diagnosis not present

## 2017-10-28 DIAGNOSIS — F4325 Adjustment disorder with mixed disturbance of emotions and conduct: Secondary | ICD-10-CM | POA: Diagnosis not present

## 2017-11-11 DIAGNOSIS — F4325 Adjustment disorder with mixed disturbance of emotions and conduct: Secondary | ICD-10-CM | POA: Diagnosis not present

## 2017-11-25 DIAGNOSIS — F4325 Adjustment disorder with mixed disturbance of emotions and conduct: Secondary | ICD-10-CM | POA: Diagnosis not present

## 2017-12-09 DIAGNOSIS — F4325 Adjustment disorder with mixed disturbance of emotions and conduct: Secondary | ICD-10-CM | POA: Diagnosis not present

## 2018-01-06 DIAGNOSIS — F4325 Adjustment disorder with mixed disturbance of emotions and conduct: Secondary | ICD-10-CM | POA: Diagnosis not present

## 2018-01-27 DIAGNOSIS — F4325 Adjustment disorder with mixed disturbance of emotions and conduct: Secondary | ICD-10-CM | POA: Diagnosis not present

## 2018-02-20 DIAGNOSIS — H524 Presbyopia: Secondary | ICD-10-CM | POA: Diagnosis not present

## 2018-02-24 DIAGNOSIS — F4325 Adjustment disorder with mixed disturbance of emotions and conduct: Secondary | ICD-10-CM | POA: Diagnosis not present

## 2018-03-26 DIAGNOSIS — Z01419 Encounter for gynecological examination (general) (routine) without abnormal findings: Secondary | ICD-10-CM | POA: Diagnosis not present

## 2018-03-26 DIAGNOSIS — Z1231 Encounter for screening mammogram for malignant neoplasm of breast: Secondary | ICD-10-CM | POA: Diagnosis not present

## 2018-03-26 DIAGNOSIS — Z6822 Body mass index (BMI) 22.0-22.9, adult: Secondary | ICD-10-CM | POA: Diagnosis not present

## 2018-03-31 DIAGNOSIS — F4325 Adjustment disorder with mixed disturbance of emotions and conduct: Secondary | ICD-10-CM | POA: Diagnosis not present

## 2018-06-11 DIAGNOSIS — J45990 Exercise induced bronchospasm: Secondary | ICD-10-CM | POA: Diagnosis not present

## 2018-06-11 DIAGNOSIS — G43109 Migraine with aura, not intractable, without status migrainosus: Secondary | ICD-10-CM | POA: Diagnosis not present

## 2018-08-21 DIAGNOSIS — F4325 Adjustment disorder with mixed disturbance of emotions and conduct: Secondary | ICD-10-CM | POA: Diagnosis not present

## 2018-09-04 DIAGNOSIS — F4325 Adjustment disorder with mixed disturbance of emotions and conduct: Secondary | ICD-10-CM | POA: Diagnosis not present

## 2018-09-11 DIAGNOSIS — F4325 Adjustment disorder with mixed disturbance of emotions and conduct: Secondary | ICD-10-CM | POA: Diagnosis not present

## 2018-09-19 IMAGING — CR DG KNEE 1-2V*R*
2 series · 2 of 2 positions shown · non-contrast
Comparison: None.

CLINICAL DATA: Knee pain. History of prior ACL repair in [REDACTED].

EXAM:
RIGHT KNEE - 1-2 VIEW

[w knee ap right]
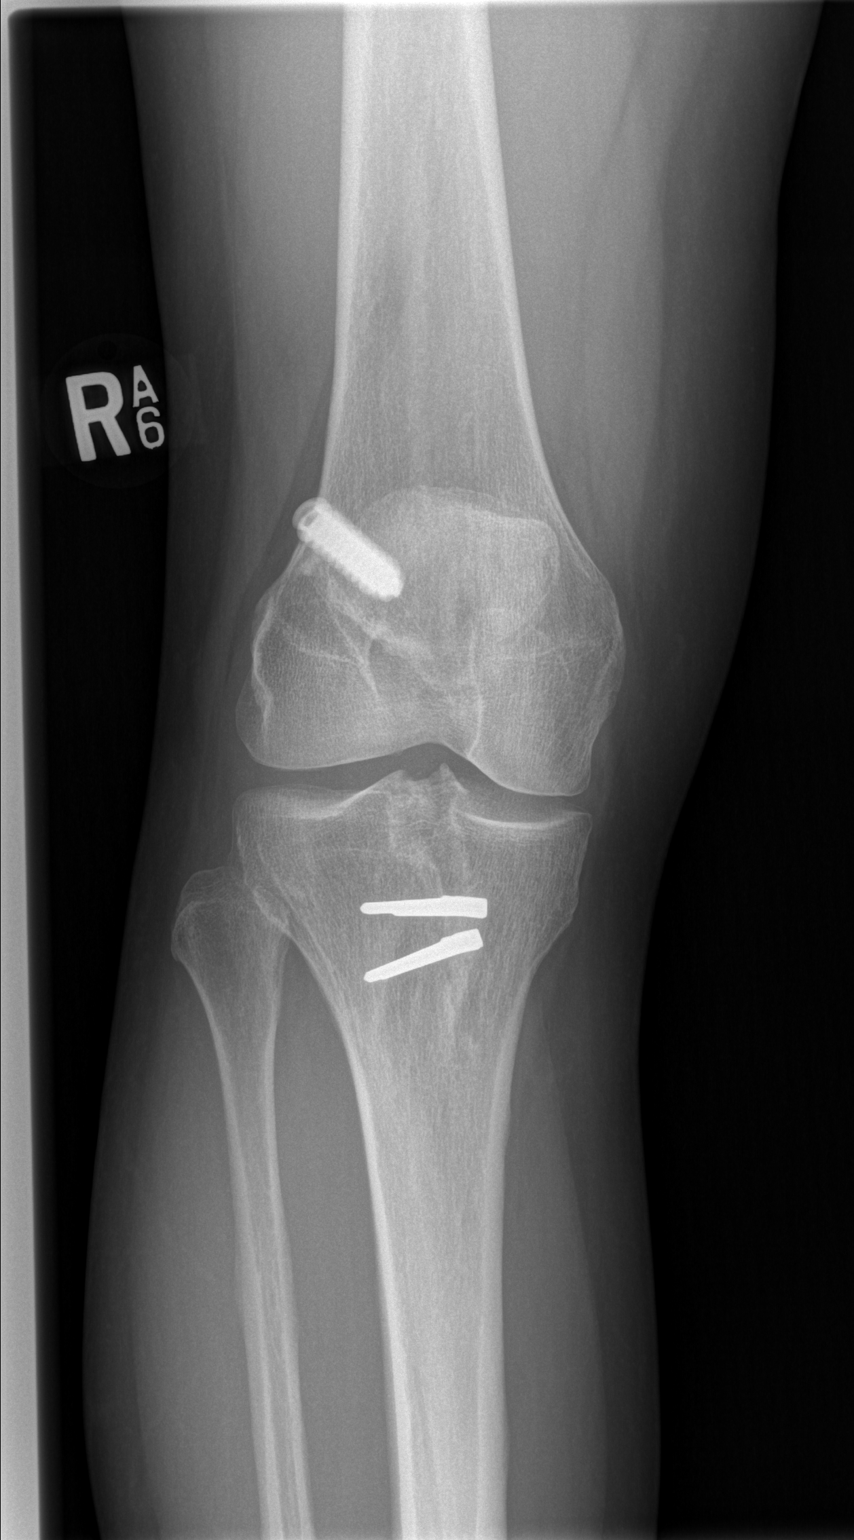

[w knee lat. right]
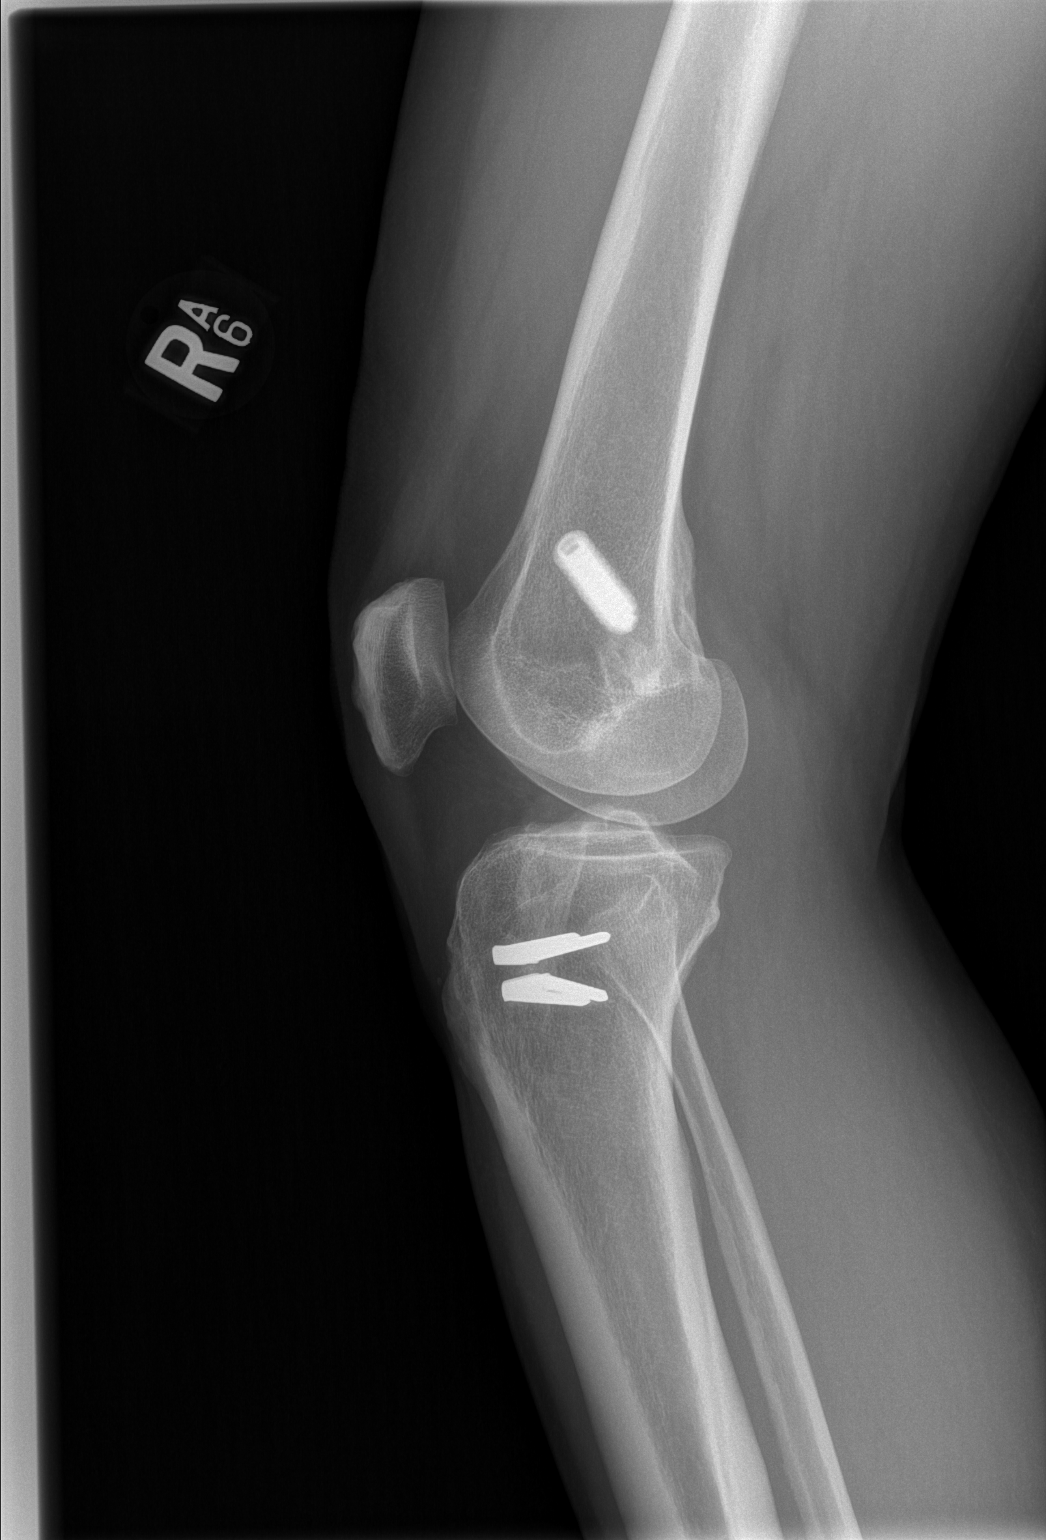

[2 of 2 positions shown; findings below may reference images not displayed]

FINDINGS: Status post ACL repair with intact orthopedic hardware and tunneled
defects along the expected location of the repaired ACL. No
significant joint space narrowing, effusion or malalignment. No
acute fracture nor suspicious osseous lesions.
IMPRESSION: No acute osseous abnormality.  Status post ACL repair.

## 2018-10-13 DIAGNOSIS — D225 Melanocytic nevi of trunk: Secondary | ICD-10-CM | POA: Diagnosis not present

## 2018-10-13 DIAGNOSIS — D2261 Melanocytic nevi of right upper limb, including shoulder: Secondary | ICD-10-CM | POA: Diagnosis not present

## 2018-10-13 DIAGNOSIS — L821 Other seborrheic keratosis: Secondary | ICD-10-CM | POA: Diagnosis not present

## 2018-10-13 DIAGNOSIS — D1801 Hemangioma of skin and subcutaneous tissue: Secondary | ICD-10-CM | POA: Diagnosis not present

## 2018-10-13 DIAGNOSIS — L814 Other melanin hyperpigmentation: Secondary | ICD-10-CM | POA: Diagnosis not present

## 2018-10-13 DIAGNOSIS — D2272 Melanocytic nevi of left lower limb, including hip: Secondary | ICD-10-CM | POA: Diagnosis not present

## 2018-10-13 DIAGNOSIS — D2271 Melanocytic nevi of right lower limb, including hip: Secondary | ICD-10-CM | POA: Diagnosis not present

## 2018-10-13 DIAGNOSIS — D235 Other benign neoplasm of skin of trunk: Secondary | ICD-10-CM | POA: Diagnosis not present

## 2018-10-30 DIAGNOSIS — R42 Dizziness and giddiness: Secondary | ICD-10-CM | POA: Diagnosis not present

## 2018-10-30 DIAGNOSIS — Z13220 Encounter for screening for lipoid disorders: Secondary | ICD-10-CM | POA: Diagnosis not present

## 2018-10-30 DIAGNOSIS — Z1231 Encounter for screening mammogram for malignant neoplasm of breast: Secondary | ICD-10-CM | POA: Diagnosis not present

## 2018-10-30 DIAGNOSIS — Z1211 Encounter for screening for malignant neoplasm of colon: Secondary | ICD-10-CM | POA: Diagnosis not present

## 2018-10-30 DIAGNOSIS — Z0000 Encounter for general adult medical examination without abnormal findings: Secondary | ICD-10-CM | POA: Diagnosis not present

## 2018-10-30 DIAGNOSIS — G43109 Migraine with aura, not intractable, without status migrainosus: Secondary | ICD-10-CM | POA: Diagnosis not present

## 2018-10-30 DIAGNOSIS — J45990 Exercise induced bronchospasm: Secondary | ICD-10-CM | POA: Diagnosis not present

## 2018-11-13 MED FILL — ELETRIPTAN HBR 40 MG TABLET: 40 | 10 days supply | Qty: 4 | Fill #0

## 2019-03-05 DIAGNOSIS — H524 Presbyopia: Secondary | ICD-10-CM | POA: Diagnosis not present

## 2019-04-07 ENCOUNTER — Other Ambulatory Visit (HOSPITAL_COMMUNITY): Payer: Self-pay | Admitting: Obstetrics and Gynecology

## 2019-04-07 DIAGNOSIS — Z1231 Encounter for screening mammogram for malignant neoplasm of breast: Secondary | ICD-10-CM | POA: Diagnosis not present

## 2019-04-07 DIAGNOSIS — Z01419 Encounter for gynecological examination (general) (routine) without abnormal findings: Secondary | ICD-10-CM | POA: Diagnosis not present

## 2019-04-07 DIAGNOSIS — R8279 Other abnormal findings on microbiological examination of urine: Secondary | ICD-10-CM | POA: Diagnosis not present

## 2019-04-07 DIAGNOSIS — Z6822 Body mass index (BMI) 22.0-22.9, adult: Secondary | ICD-10-CM | POA: Diagnosis not present

## 2019-04-07 DIAGNOSIS — Z0000 Encounter for general adult medical examination without abnormal findings: Secondary | ICD-10-CM | POA: Diagnosis not present

## 2019-04-16 MED FILL — AMPICILLIN TR 500 MG CAP: 500 | 7 days supply | Qty: 14 | Fill #0

## 2019-04-20 DIAGNOSIS — R82998 Other abnormal findings in urine: Secondary | ICD-10-CM | POA: Diagnosis not present

## 2019-04-23 DIAGNOSIS — Z13820 Encounter for screening for osteoporosis: Secondary | ICD-10-CM | POA: Diagnosis not present

## 2019-05-17 MED FILL — ELETRIPTAN HYDROBROMIDE 40: 40 | 18 days supply | Qty: 7 | Fill #0

## 2019-09-08 MED FILL — SODIUM FLUORIDE 5000 PPM 1.: 1.1 | 30 days supply | Qty: 100 | Fill #0

## 2019-10-12 DIAGNOSIS — C44712 Basal cell carcinoma of skin of right lower limb, including hip: Secondary | ICD-10-CM | POA: Diagnosis not present

## 2019-10-12 DIAGNOSIS — D2272 Melanocytic nevi of left lower limb, including hip: Secondary | ICD-10-CM | POA: Diagnosis not present

## 2019-10-12 DIAGNOSIS — D1801 Hemangioma of skin and subcutaneous tissue: Secondary | ICD-10-CM | POA: Diagnosis not present

## 2019-10-12 DIAGNOSIS — D485 Neoplasm of uncertain behavior of skin: Secondary | ICD-10-CM | POA: Diagnosis not present

## 2019-10-12 DIAGNOSIS — D2261 Melanocytic nevi of right upper limb, including shoulder: Secondary | ICD-10-CM | POA: Diagnosis not present

## 2019-10-12 DIAGNOSIS — L918 Other hypertrophic disorders of the skin: Secondary | ICD-10-CM | POA: Diagnosis not present

## 2019-10-12 DIAGNOSIS — L649 Androgenic alopecia, unspecified: Secondary | ICD-10-CM | POA: Diagnosis not present

## 2019-10-12 DIAGNOSIS — L821 Other seborrheic keratosis: Secondary | ICD-10-CM | POA: Diagnosis not present

## 2019-10-12 DIAGNOSIS — D235 Other benign neoplasm of skin of trunk: Secondary | ICD-10-CM | POA: Diagnosis not present

## 2019-11-05 MED FILL — ELETRIPTAN HYDROBROMIDE 40: 40 | 18 days supply | Qty: 7 | Fill #1

## 2019-11-12 DIAGNOSIS — E559 Vitamin D deficiency, unspecified: Secondary | ICD-10-CM | POA: Diagnosis not present

## 2019-11-12 DIAGNOSIS — L659 Nonscarring hair loss, unspecified: Secondary | ICD-10-CM | POA: Diagnosis not present

## 2019-11-12 DIAGNOSIS — Z1211 Encounter for screening for malignant neoplasm of colon: Secondary | ICD-10-CM | POA: Diagnosis not present

## 2019-11-12 DIAGNOSIS — R42 Dizziness and giddiness: Secondary | ICD-10-CM | POA: Diagnosis not present

## 2019-11-12 DIAGNOSIS — J45990 Exercise induced bronchospasm: Secondary | ICD-10-CM | POA: Diagnosis not present

## 2019-11-12 DIAGNOSIS — G43109 Migraine with aura, not intractable, without status migrainosus: Secondary | ICD-10-CM | POA: Diagnosis not present

## 2019-11-12 DIAGNOSIS — Z1231 Encounter for screening mammogram for malignant neoplasm of breast: Secondary | ICD-10-CM | POA: Diagnosis not present

## 2019-11-12 DIAGNOSIS — Z0000 Encounter for general adult medical examination without abnormal findings: Secondary | ICD-10-CM | POA: Diagnosis not present

## 2020-02-28 MED FILL — ELETRIPTAN HYDROBROMIDE 40: 40 | 30 days supply | Qty: 7 | Fill #2

## 2020-03-10 DIAGNOSIS — M25562 Pain in left knee: Secondary | ICD-10-CM | POA: Diagnosis not present

## 2020-03-17 DIAGNOSIS — H5213 Myopia, bilateral: Secondary | ICD-10-CM | POA: Diagnosis not present

## 2020-03-21 DIAGNOSIS — S83512A Sprain of anterior cruciate ligament of left knee, initial encounter: Secondary | ICD-10-CM | POA: Diagnosis not present

## 2020-03-21 DIAGNOSIS — M25562 Pain in left knee: Secondary | ICD-10-CM | POA: Diagnosis not present

## 2020-03-31 DIAGNOSIS — M25562 Pain in left knee: Secondary | ICD-10-CM | POA: Diagnosis not present

## 2020-04-14 ENCOUNTER — Other Ambulatory Visit: Payer: Self-pay | Admitting: Orthopaedic Surgery

## 2020-04-14 DIAGNOSIS — Z1231 Encounter for screening mammogram for malignant neoplasm of breast: Secondary | ICD-10-CM | POA: Diagnosis not present

## 2020-04-14 DIAGNOSIS — Z01419 Encounter for gynecological examination (general) (routine) without abnormal findings: Secondary | ICD-10-CM | POA: Diagnosis not present

## 2020-04-14 DIAGNOSIS — Z6822 Body mass index (BMI) 22.0-22.9, adult: Secondary | ICD-10-CM | POA: Diagnosis not present

## 2020-04-14 DIAGNOSIS — Z760 Encounter for issue of repeat prescription: Secondary | ICD-10-CM | POA: Diagnosis not present

## 2020-04-18 ENCOUNTER — Other Ambulatory Visit: Payer: Self-pay | Admitting: Obstetrics and Gynecology

## 2020-04-18 DIAGNOSIS — R928 Other abnormal and inconclusive findings on diagnostic imaging of breast: Secondary | ICD-10-CM

## 2020-05-05 ENCOUNTER — Other Ambulatory Visit: Payer: Self-pay

## 2020-05-08 NOTE — Patient Instructions (Addendum)
DUE TO COVID-19 ONLY ONE VISITOR IS ALLOWED TO COME WITH YOU AND STAY IN THE WAITING ROOM ONLY DURING PRE OP AND PROCEDURE DAY OF SURGERY. THE 1 VISITOR  MAY VISIT WITH YOU AFTER SURGERY IN YOUR PRIVATE ROOM DURING VISITING HOURS ONLY!  YOU NEED TO HAVE A COVID 19 TEST ON: 05/19/20 @ 8:30 AM , THIS TEST MUST BE DONE BEFORE SURGERY,  COVID TESTING SITE [place] [address], IT IS ON THE RIGHT GOING OUT [address] APPROXIMATELY  2 MINUTES PAST [employer] ON THE RIGHT. ONCE YOUR COVID TEST IS COMPLETED,  PLEASE BEGIN THE QUARANTINE INSTRUCTIONS AS OUTLINED IN YOUR HANDOUT.                Monica Mcgee   Your procedure is scheduled on: 05/23/20    Report to [facility] Main  Entrance   Report to admitting at: 2:15 PM     Call this number if you have problems the morning of surgery [phone]    Remember:  NO SOLID FOOD AFTER MIDNIGHT THE NIGHT PRIOR TO SURGERY. NOTHING BY MOUTH EXCEPT CLEAR LIQUIDS UNTIL: 1:15 PM . PLEASE FINISH ENSURE DRINK PER SURGEON ORDER  WHICH NEEDS TO BE COMPLETED AT: 1:15 PM .  CLEAR LIQUID DIET  Foods Allowed                                                                     Foods Excluded  Coffee and tea, regular and decaf                             liquids that you cannot  Plain Jell-O any favor except red or purple                                           see through such as: Fruit ices (not with fruit pulp)                                     milk, soups, orange juice  Iced Popsicles                                    All solid food Carbonated beverages, regular and diet                                    Cranberry, grape and apple juices Sports drinks like Gatorade Lightly seasoned clear broth or consume(fat free) Sugar, honey syrup  Sample Menu Breakfast                                Lunch  Supper Cranberry juice                    Beef broth                             Chicken broth Jell-O                                     Grape juice                           Apple juice Coffee or tea                        Jell-O                                      Popsicle                                                Coffee or tea                        Coffee or tea  _____________________________________________________________________   BRUSH YOUR TEETH MORNING OF SURGERY AND RINSE YOUR MOUTH OUT, NO CHEWING GUM CANDY OR MINTS.     Take these medicines the morning of surgery with A SIP OF WATER: Eletriptan as needed.                               You may not have any metal on your body including hair pins and              piercings  Do not wear jewelry, make-up, lotions, powders or perfumes, deodorant             Do not wear nail polish on your fingernails.  Do not shave  48 hours prior to surgery.    Do not bring valuables to the hospital. [facility].  Contacts, dentures or bridgework may not be worn into surgery.  Leave suitcase in the car. After surgery it may be brought to your room.     Patients discharged the day of surgery will not be allowed to drive home. IF YOU ARE HAVING SURGERY AND GOING HOME THE SAME DAY, YOU MUST HAVE AN ADULT TO DRIVE YOU HOME AND BE WITH YOU FOR 24 HOURS. YOU MAY GO HOME BY TAXI OR UBER OR ORTHERWISE, BUT AN ADULT MUST ACCOMPANY YOU HOME AND STAY WITH YOU FOR 24 HOURS.  Name and phone number of your driver:  Special Instructions: N/A              Please read over the following fact sheets you were given: _____________________________________________________________________         [facility] - Preparing for Surgery Before surgery, you can play an important role.  Because skin is not sterile, your skin needs to be as free of germs as possible.  You can reduce the number of germs on your skin by washing with CHG (chlorahexidine gluconate) soap before surgery.  CHG is an  antiseptic cleaner which kills germs and bonds with the skin to continue killing germs even after washing. Please DO NOT use if you have an allergy to CHG or antibacterial soaps.  If your skin becomes reddened/irritated stop using the CHG and inform your nurse when you arrive at Short Stay. Do not shave (including legs and underarms) for at least 48 hours prior to the first CHG shower.  You may shave your face/neck. Please follow these instructions carefully:  1.  Shower with CHG Soap the night before surgery and the  morning of Surgery.  2.  If you choose to wash your hair, wash your hair first as usual with your  normal  shampoo.  3.  After you shampoo, rinse your hair and body thoroughly to remove the  shampoo.                           4.  Use CHG as you would any other liquid soap.  You can apply chg directly  to the skin and wash                       Gently with a scrungie or clean washcloth.  5.  Apply the CHG Soap to your body ONLY FROM THE NECK DOWN.   Do not use on face/ open                           Wound or open sores. Avoid contact with eyes, ears mouth and genitals (private parts).                       Wash face,  Genitals (private parts) with your normal soap.             6.  Wash thoroughly, paying special attention to the area where your surgery  will be performed.  7.  Thoroughly rinse your body with warm water from the neck down.  8.  DO NOT shower/wash with your normal soap after using and rinsing off  the CHG Soap.                9.  Pat yourself dry with a clean towel.            10.  Wear clean pajamas.            11.  Place clean sheets on your bed the night of your first shower and do not  sleep with pets. Day of Surgery : Do not apply any lotions/deodorants the morning of surgery.  Please wear clean clothes to the hospital/surgery center.  FAILURE TO FOLLOW THESE INSTRUCTIONS MAY RESULT IN THE CANCELLATION OF YOUR SURGERY PATIENT  SIGNATURE_________________________________  NURSE SIGNATURE__________________________________  ________________________________________________________________________   Adam Phenix  An incentive spirometer is a tool that can help keep your lungs clear and active. This tool measures how well you are filling your lungs with each breath. Taking long deep breaths may help reverse or decrease the chance of developing breathing (pulmonary) problems (especially infection) following:  A long period of time when you are unable to move or be active. BEFORE THE PROCEDURE   If the spirometer includes an indicator to show your best effort, your nurse or respiratory therapist will  set it to a desired goal.  If possible, sit up straight or lean slightly forward. Try not to slouch.  Hold the incentive spirometer in an upright position. INSTRUCTIONS FOR USE  1. Sit on the edge of your bed if possible, or sit up as far as you can in bed or on a chair. 2. Hold the incentive spirometer in an upright position. 3. Breathe out normally. 4. Place the mouthpiece in your mouth and seal your lips tightly around it. 5. Breathe in slowly and as deeply as possible, raising the piston or the ball toward the top of the column. 6. Hold your breath for 3-5 seconds or for as long as possible. Allow the piston or ball to fall to the bottom of the column. 7. Remove the mouthpiece from your mouth and breathe out normally. 8. Rest for a few seconds and repeat Steps 1 through 7 at least 10 times every 1-2 hours when you are awake. Take your time and take a few normal breaths between deep breaths. 9. The spirometer may include an indicator to show your best effort. Use the indicator as a goal to work toward during each repetition. 10. After each set of 10 deep breaths, practice coughing to be sure your lungs are clear. If you have an incision (the cut made at the time of surgery), support your incision when coughing  by placing a pillow or rolled up towels firmly against it. Once you are able to get out of bed, walk around indoors and cough well. You may stop using the incentive spirometer when instructed by your caregiver.  RISKS AND COMPLICATIONS  Take your time so you do not get dizzy or light-headed.  If you are in pain, you may need to take or ask for pain medication before doing incentive spirometry. It is harder to take a deep breath if you are having pain. AFTER USE  Rest and breathe slowly and easily.  It can be helpful to keep track of a log of your progress. Your caregiver can provide you with a simple table to help with this. If you are using the spirometer at home, follow these instructions: [place] IF:   You are having difficultly using the spirometer.  You have trouble using the spirometer as often as instructed.  Your pain medication is not giving enough relief while using the spirometer.  You develop fever of 100.5 F (38.1 C) or higher. SEEK IMMEDIATE MEDICAL CARE IF:   You cough up bloody sputum that had not been present before.  You develop fever of 102 F (38.9 C) or greater.  You develop worsening pain at or near the incision site. MAKE SURE YOU:   Understand these instructions.  Will watch your condition.  Will get help right away if you are not doing well or get worse. Document Released: 05/27/2006 Document Revised: 04/08/2011 Document Reviewed: 07/28/2006 [facility] Patient Information [address], [place].   ________________________________________________________________________

## 2020-05-11 ENCOUNTER — Encounter (HOSPITAL_COMMUNITY): Payer: Self-pay

## 2020-05-11 ENCOUNTER — Encounter (HOSPITAL_COMMUNITY)
Admission: RE | Admit: 2020-05-11 | Discharge: 2020-05-11 | Disposition: A | Discharge status: Home / Self Care | Payer: 59 | Source: Ambulatory Visit | Attending: Orthopaedic Surgery | Admitting: Orthopaedic Surgery

## 2020-05-11 ENCOUNTER — Other Ambulatory Visit: Payer: Self-pay

## 2020-05-11 DIAGNOSIS — Z01812 Encounter for preprocedural laboratory examination: Secondary | ICD-10-CM | POA: Insufficient documentation

## 2020-05-11 LAB — CBC
HCT: 39.2 % (ref 36.0–46.0)
Hemoglobin: 13.1 g/dL (ref 12.0–15.0)
MCH: 32.3 pg (ref 26.0–34.0)
MCHC: 33.4 g/dL (ref 30.0–36.0)
MCV: 96.6 fL (ref 80.0–100.0)
Platelets: 191 10*3/uL (ref 150–400)
RBC: 4.06 MIL/uL (ref 3.87–5.11)
RDW: 12.2 % (ref 11.5–15.5)
WBC: 3 10*3/uL — ABNORMAL LOW (ref 4.0–10.5)
nRBC: 0 % (ref 0.0–0.2)

## 2020-05-11 NOTE — Progress Notes (Signed)
COVID Vaccine Completed: Yes Date COVID Vaccine completed: 09/2019 [place] COVID vaccine manufacturer: Pfizer      PCP - Dr. Leeroy Cha Cardiologist -   Chest x-ray -  EKG -  Stress Test -  ECHO -  Cardiac Cath -  Pacemaker/ICD device last checked:  Sleep Study -  CPAP -   Fasting Blood Sugar -  Checks Blood Sugar _____ times a day  Blood Thinner Instructions: Aspirin Instructions: Last Dose:  Anesthesia review:   Patient denies shortness of breath, fever, cough and chest pain at PAT appointment   Patient verbalized understanding of instructions that were given to them at the PAT appointment. Patient was also instructed that they will need to review over the PAT instructions again at home before surgery.

## 2020-05-12 ENCOUNTER — Ambulatory Visit
Admission: RE | Admit: 2020-05-12 | Discharge: 2020-05-12 | Disposition: A | Discharge status: Home / Self Care | Payer: 59 | Source: Ambulatory Visit | Attending: Obstetrics and Gynecology | Admitting: Obstetrics and Gynecology

## 2020-05-12 ENCOUNTER — Ambulatory Visit: Payer: Self-pay

## 2020-05-12 DIAGNOSIS — R928 Other abnormal and inconclusive findings on diagnostic imaging of breast: Secondary | ICD-10-CM

## 2020-05-18 ENCOUNTER — Other Ambulatory Visit: Payer: Self-pay

## 2020-05-19 ENCOUNTER — Other Ambulatory Visit (HOSPITAL_COMMUNITY)
Admission: RE | Admit: 2020-05-19 | Discharge: 2020-05-19 | Disposition: A | Discharge status: Home / Self Care | Payer: 59 | Source: Ambulatory Visit | Attending: Orthopaedic Surgery | Admitting: Orthopaedic Surgery

## 2020-05-19 DIAGNOSIS — Z01812 Encounter for preprocedural laboratory examination: Secondary | ICD-10-CM | POA: Diagnosis not present

## 2020-05-19 DIAGNOSIS — Z20822 Contact with and (suspected) exposure to covid-19: Secondary | ICD-10-CM | POA: Diagnosis not present

## 2020-05-20 LAB — SARS CORONAVIRUS 2 (TAT 6-24 HRS): SARS Coronavirus 2: NEGATIVE

## 2020-05-22 DIAGNOSIS — H02831 Dermatochalasis of right upper eyelid: Secondary | ICD-10-CM | POA: Diagnosis not present

## 2020-05-22 DIAGNOSIS — H02423 Myogenic ptosis of bilateral eyelids: Secondary | ICD-10-CM | POA: Diagnosis not present

## 2020-05-22 DIAGNOSIS — H02834 Dermatochalasis of left upper eyelid: Secondary | ICD-10-CM | POA: Diagnosis not present

## 2020-05-22 NOTE — H&P (Signed)
Monica Mcgee is an 60 y.o. female.   Chief Complaint: left knee pain HPI: Monica Mcgee is in again about her left knee.  She does not trust it and it can give way with awkward motions.  She has been through an MRI scan since the last time she was in.  Her past medical, family, and social histories are reviewed and are unchanged.  She has a questionable allergy to amoxicillin which did not involve any significant reaction.  She is on no medications chronically.  She does not smoke.  She continues to work as a Radiographer, therapeutic.  MRI:  I reviewed an MRI scan films and report of a study done at the [facility] on 03/21/20.  The radiologist reading is suboptimal and they read that she has a normal ACL which is obviously absent on the scan.  They also read a meniscal tear which I do not appreciate.  She does have the typical bone bruises consistent with an ACL tear.  Past Medical History:  Diagnosis Date  . Migraines   . Vertigo    Benign vestibular    Past Surgical History:  Procedure Laterality Date  . [place]   right  . cone procedure     in her 75's    Family History  Problem Relation Age of Onset  . Migraines Mother   . Thyroid disease Mother   . Macular degeneration Father   . Migraines Sister   . Migraines Brother   . Colon cancer Neg Hx   . Esophageal cancer Neg Hx   . Stomach cancer Neg Hx   . Ulcerative colitis Neg Hx    Social History:  reports that she has never smoked. She has never used smokeless tobacco. She reports current alcohol use. She reports that she does not use drugs.  Allergies:  Allergies  Allergen Reactions  . Amoxicillin     Questionable Rash     No medications prior to admission.    No results found for this or any previous visit (from the past 48 hour(s)). No results found.  Review of Systems  Musculoskeletal: Positive for arthralgias.       Left knee  All other systems reviewed and are negative.   Last  menstrual period 10/30/2015. Physical Exam Constitutional:      Appearance: Normal appearance.  HENT:     Head: Normocephalic and atraumatic.     Mouth/Throat:     Mouth: Mucous membranes are moist.     Pharynx: Oropharynx is clear.  Eyes:     Extraocular Movements: Extraocular movements intact.  Cardiovascular:     Rate and Rhythm: Normal rate and regular rhythm.     Pulses: Normal pulses.  Pulmonary:     Effort: Pulmonary effort is normal.  Abdominal:     Palpations: Abdomen is soft.  Musculoskeletal:     Comments: Left knee has obvious laxity to Lachman's test.  She has good stability to varus and valgus stress.  I do not get much joint line pain.  There is no effusion.  Hip motion is full and straight leg raise is negative.  Sensation and motor function are intact in her feet with palpable pulses on both sides.  Skin:    General: Skin is warm and dry.  Neurological:     Mental Status: She is alert and oriented to person, place, and time.  Psychiatric:        Mood and Affect: Mood normal.  Behavior: Behavior normal.        Thought Content: Thought content normal.        Judgment: Judgment normal.      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  I think she should consider an ACL reconstruction at this point.  I reviewed risk of anesthesia, infection, DVT.  I discussed the use of an allograft.  I have stressed the importance of postoperative PT.   Larwance Sachs Terrell Shimko, PA-C 05/22/2020, 9:52 AM

## 2020-05-23 ENCOUNTER — Other Ambulatory Visit (HOSPITAL_COMMUNITY): Payer: Self-pay

## 2020-05-23 ENCOUNTER — Ambulatory Visit (HOSPITAL_COMMUNITY): Payer: 59 | Admitting: Certified Registered Nurse Anesthetist

## 2020-05-23 ENCOUNTER — Encounter (HOSPITAL_COMMUNITY): Payer: Self-pay | Admitting: Orthopaedic Surgery

## 2020-05-23 ENCOUNTER — Encounter (HOSPITAL_COMMUNITY)
Admission: RE | Disposition: A | Discharge status: Home / Self Care | Payer: Self-pay | Source: Home / Self Care | Attending: Orthopaedic Surgery

## 2020-05-23 ENCOUNTER — Ambulatory Visit (HOSPITAL_COMMUNITY)
Admission: RE | Admit: 2020-05-23 | Discharge: 2020-05-23 | Disposition: A | Discharge status: Home / Self Care | Payer: 59 | Attending: Orthopaedic Surgery | Admitting: Orthopaedic Surgery

## 2020-05-23 DIAGNOSIS — S83512A Sprain of anterior cruciate ligament of left knee, initial encounter: Secondary | ICD-10-CM | POA: Insufficient documentation

## 2020-05-23 DIAGNOSIS — X58XXXA Exposure to other specified factors, initial encounter: Secondary | ICD-10-CM | POA: Insufficient documentation

## 2020-05-23 DIAGNOSIS — S83242A Other tear of medial meniscus, current injury, left knee, initial encounter: Secondary | ICD-10-CM | POA: Diagnosis not present

## 2020-05-23 DIAGNOSIS — G8918 Other acute postprocedural pain: Secondary | ICD-10-CM | POA: Diagnosis not present

## 2020-05-23 DIAGNOSIS — Z880 Allergy status to penicillin: Secondary | ICD-10-CM | POA: Diagnosis not present

## 2020-05-23 DIAGNOSIS — G43909 Migraine, unspecified, not intractable, without status migrainosus: Secondary | ICD-10-CM | POA: Diagnosis not present

## 2020-05-23 DIAGNOSIS — M23322 Other meniscus derangements, posterior horn of medial meniscus, left knee: Secondary | ICD-10-CM | POA: Diagnosis not present

## 2020-05-23 HISTORY — PX: ANTERIOR CRUCIATE LIGAMENT REPAIR: SHX115

## 2020-05-23 SURGERY — RECONSTRUCTION, KNEE, ACL
Anesthesia: General | Site: Knee | Laterality: Left

## 2020-05-23 MED ORDER — PROPOFOL 10 MG/ML IV BOLUS
INTRAVENOUS | Status: DC | PRN
  Administered 2020-05-23: 100 mg via INTRAVENOUS

## 2020-05-23 MED ORDER — SCOPOLAMINE 1 MG/3DAYS TD PT72
MEDICATED_PATCH | TRANSDERMAL | Status: AC
  Filled 2020-05-23: qty 1

## 2020-05-23 MED ORDER — EPINEPHRINE PF 1 MG/ML IJ SOLN
INTRAMUSCULAR | Status: DC | PRN
  Administered 2020-05-23: 1 mg

## 2020-05-23 MED ORDER — CHLORHEXIDINE GLUCONATE 0.12 % MT SOLN
15.0000 mL | Freq: Once | OROMUCOSAL | Status: AC
  Administered 2020-05-23: 15 mL via OROMUCOSAL

## 2020-05-23 MED ORDER — POVIDONE-IODINE 10 % EX SWAB
2.0000 "application " | Freq: Once | CUTANEOUS | Status: AC
  Administered 2020-05-23: 2 via TOPICAL

## 2020-05-23 MED ORDER — FENTANYL CITRATE (PF) 100 MCG/2ML IJ SOLN
INTRAMUSCULAR | Status: AC
  Administered 2020-05-23: 50 ug via INTRAVENOUS
  Filled 2020-05-23: qty 2

## 2020-05-23 MED ORDER — SCOPOLAMINE 1 MG/3DAYS TD PT72
MEDICATED_PATCH | TRANSDERMAL | Status: DC | PRN
  Administered 2020-05-23: 1 via TRANSDERMAL

## 2020-05-23 MED ORDER — EPINEPHRINE PF 1 MG/ML IJ SOLN
INTRAMUSCULAR | Status: AC
  Filled 2020-05-23: qty 1

## 2020-05-23 MED ORDER — DEXAMETHASONE SODIUM PHOSPHATE 4 MG/ML IJ SOLN
INTRAMUSCULAR | Status: DC | PRN
  Administered 2020-05-23: 5 mg via INTRAVENOUS

## 2020-05-23 MED ORDER — EPHEDRINE SULFATE-NACL 50-0.9 MG/10ML-% IV SOSY
PREFILLED_SYRINGE | INTRAVENOUS | Status: DC | PRN
  Administered 2020-05-23: 10 mg via INTRAVENOUS
  Administered 2020-05-23: 5 mg via INTRAVENOUS

## 2020-05-23 MED ORDER — SODIUM CHLORIDE 0.9 % IR SOLN
Status: DC | PRN
  Administered 2020-05-23: 1000 mL

## 2020-05-23 MED ORDER — EPHEDRINE 5 MG/ML INJ
INTRAVENOUS | Status: AC
  Filled 2020-05-23: qty 10

## 2020-05-23 MED ORDER — MIDAZOLAM HCL 2 MG/2ML IJ SOLN
INTRAMUSCULAR | Status: AC
  Administered 2020-05-23: 1 mg via INTRAVENOUS
  Filled 2020-05-23: qty 2

## 2020-05-23 MED ORDER — LIDOCAINE 2% (20 MG/ML) 5 ML SYRINGE
INTRAMUSCULAR | Status: DC | PRN
  Administered 2020-05-23: 50 mg via INTRAVENOUS

## 2020-05-23 MED ORDER — FENTANYL CITRATE (PF) 100 MCG/2ML IJ SOLN
INTRAMUSCULAR | Status: DC | PRN
  Administered 2020-05-23: 50 ug via INTRAVENOUS

## 2020-05-23 MED ORDER — OXYCODONE HCL 5 MG/5ML PO SOLN: 5.0000 mg | Freq: Once | ORAL | Status: DC | PRN

## 2020-05-23 MED ORDER — ROPIVACAINE HCL 7.5 MG/ML IJ SOLN
INTRAMUSCULAR | Status: DC | PRN
  Administered 2020-05-23: 20 mL via PERINEURAL

## 2020-05-23 MED ORDER — PROPOFOL 10 MG/ML IV BOLUS
INTRAVENOUS | Status: AC
  Filled 2020-05-23: qty 20

## 2020-05-23 MED ORDER — CEFAZOLIN SODIUM-DEXTROSE 2-4 GM/100ML-% IV SOLN
2.0000 g | INTRAVENOUS | Status: AC
  Administered 2020-05-23: 2 g via INTRAVENOUS
  Filled 2020-05-23: qty 100

## 2020-05-23 MED ORDER — ONDANSETRON HCL 4 MG/2ML IJ SOLN
INTRAMUSCULAR | Status: DC | PRN
  Administered 2020-05-23: 4 mg via INTRAVENOUS

## 2020-05-23 MED ORDER — HYDROCODONE-ACETAMINOPHEN 5-325 MG PO TABS
1.0000 | ORAL_TABLET | Freq: Four times a day (QID) | ORAL | 0 refills | Status: AC | PRN
  Filled 2020-05-23: qty 20, 3d supply, fill #0

## 2020-05-23 MED ORDER — ACETAMINOPHEN 500 MG PO TABS
ORAL_TABLET | ORAL | Status: AC
  Filled 2020-05-23: qty 2

## 2020-05-23 MED ORDER — MEPERIDINE HCL 50 MG/ML IJ SOLN: 6.2500 mg | INTRAMUSCULAR | Status: DC | PRN

## 2020-05-23 MED ORDER — DEXAMETHASONE SODIUM PHOSPHATE 10 MG/ML IJ SOLN
INTRAMUSCULAR | Status: AC
  Filled 2020-05-23: qty 1

## 2020-05-23 MED ORDER — BUPIVACAINE-EPINEPHRINE 0.5% -1:200000 IJ SOLN
INTRAMUSCULAR | Status: DC | PRN
  Administered 2020-05-23: 30 mL

## 2020-05-23 MED ORDER — MIDAZOLAM HCL 2 MG/2ML IJ SOLN: 1.0000 mg | Freq: Once | INTRAMUSCULAR | Status: AC

## 2020-05-23 MED ORDER — FENTANYL CITRATE (PF) 100 MCG/2ML IJ SOLN: 25.0000 ug | Freq: Once | INTRAMUSCULAR | Status: AC

## 2020-05-23 MED ORDER — LACTATED RINGERS IV SOLN: INTRAVENOUS | Status: DC

## 2020-05-23 MED ORDER — ACETAMINOPHEN 500 MG PO TABS
1000.0000 mg | ORAL_TABLET | Freq: Once | ORAL | Status: AC
  Administered 2020-05-23: 1000 mg via ORAL

## 2020-05-23 MED ORDER — DROPERIDOL 2.5 MG/ML IJ SOLN: 0.6250 mg | Freq: Once | INTRAMUSCULAR | Status: DC | PRN

## 2020-05-23 MED ORDER — BUPIVACAINE-EPINEPHRINE 0.5% -1:200000 IJ SOLN
INTRAMUSCULAR | Status: AC
  Filled 2020-05-23: qty 1

## 2020-05-23 MED ORDER — FENTANYL CITRATE (PF) 250 MCG/5ML IJ SOLN
INTRAMUSCULAR | Status: AC
  Filled 2020-05-23: qty 5

## 2020-05-23 MED ORDER — HYDROMORPHONE HCL 1 MG/ML IJ SOLN: 0.2500 mg | INTRAMUSCULAR | Status: DC | PRN

## 2020-05-23 MED ORDER — OXYCODONE HCL 5 MG PO TABS: 5.0000 mg | ORAL_TABLET | Freq: Once | ORAL | Status: DC | PRN

## 2020-05-23 MED ORDER — CLONIDINE HCL (ANALGESIA) 100 MCG/ML EP SOLN
EPIDURAL | Status: DC | PRN
  Administered 2020-05-23: 80 ug

## 2020-05-23 MED ORDER — ONDANSETRON HCL 4 MG/2ML IJ SOLN
INTRAMUSCULAR | Status: AC
  Filled 2020-05-23: qty 2

## 2020-05-23 MED ORDER — DEXAMETHASONE SODIUM PHOSPHATE 4 MG/ML IJ SOLN
INTRAMUSCULAR | Status: DC | PRN
  Administered 2020-05-23: 5 mg via PERINEURAL

## 2020-05-23 MED ORDER — ORAL CARE MOUTH RINSE: 15.0000 mL | Freq: Once | OROMUCOSAL | Status: AC

## 2020-05-23 MED ORDER — LIDOCAINE 2% (20 MG/ML) 5 ML SYRINGE
INTRAMUSCULAR | Status: AC
  Filled 2020-05-23: qty 5

## 2020-05-23 MED FILL — Eletriptan Hydrobromide Tab 40 MG (Base Equivalent): ORAL | 30 days supply | Qty: 7 | Fill #0 | Status: AC

## 2020-05-23 SURGICAL SUPPLY — 62 items
APL SKNCLS STERI-STRIP NONHPOA (GAUZE/BANDAGES/DRESSINGS)
BANDAGE ESMARK 6X9 LF (GAUZE/BANDAGES/DRESSINGS) IMPLANT
BENZOIN TINCTURE PRP APPL 2/3 (GAUZE/BANDAGES/DRESSINGS) IMPLANT
BLADE AVERAGE 25X9 (BLADE) ×1 IMPLANT
BLADE EXCALIBUR 4.0X13 (MISCELLANEOUS) IMPLANT
BLADE OSCIL/SAGITTAL W/10 ST (BLADE) ×1 IMPLANT
BLADE SURG 15 STRL LF DISP TIS (BLADE) ×1 IMPLANT
BLADE SURG 15 STRL SS (BLADE) ×2
BLADE SURG SZ10 CARB STEEL (BLADE) ×2 IMPLANT
BNDG CMPR 9X6 STRL LF SNTH (GAUZE/BANDAGES/DRESSINGS)
BNDG CMPR MED 15X6 ELC VLCR LF (GAUZE/BANDAGES/DRESSINGS) ×1
BNDG ELASTIC 4X5.8 VLCR STR LF (GAUZE/BANDAGES/DRESSINGS) ×2 IMPLANT
BNDG ELASTIC 6X15 VLCR STRL LF (GAUZE/BANDAGES/DRESSINGS) ×1 IMPLANT
BNDG ELASTIC 6X5.8 VLCR STR LF (GAUZE/BANDAGES/DRESSINGS) ×2 IMPLANT
BNDG ESMARK 6X9 LF (GAUZE/BANDAGES/DRESSINGS)
CLSR STERI-STRIP ANTIMIC 1/2X4 (GAUZE/BANDAGES/DRESSINGS) ×1 IMPLANT
COVER BACK TABLE 60X90IN (DRAPES) ×2 IMPLANT
COVER WAND RF STERILE (DRAPES) ×1 IMPLANT
CUFF TOURN SGL QUICK 34 (TOURNIQUET CUFF) ×2
CUFF TRNQT CYL 34X4.125X (TOURNIQUET CUFF) ×1 IMPLANT
DISSECTOR 4.0MM X 13CM (MISCELLANEOUS) IMPLANT
DRAPE ARTHROSCOPY W/POUCH 114 (DRAPES) ×2 IMPLANT
DRAPE INCISE IOBAN 66X45 STRL (DRAPES) ×2 IMPLANT
DRSG EMULSION OIL 3X3 NADH (GAUZE/BANDAGES/DRESSINGS) ×2 IMPLANT
DRSG PAD ABDOMINAL 8X10 ST (GAUZE/BANDAGES/DRESSINGS) ×1 IMPLANT
DURAPREP 26ML APPLICATOR (WOUND CARE) IMPLANT
ELECT REM PT RETURN 15FT ADLT (MISCELLANEOUS) IMPLANT
EXCALIBUR 3.8MM X 13CM (MISCELLANEOUS) ×2 IMPLANT
GAUZE SPONGE 4X4 12PLY STRL (GAUZE/BANDAGES/DRESSINGS) ×2 IMPLANT
GLOVE SRG 8 PF TXTR STRL LF DI (GLOVE) ×2 IMPLANT
GLOVE SURG ENC MOIS LTX SZ8 (GLOVE) ×4 IMPLANT
GLOVE SURG UNDER POLY LF SZ8 (GLOVE) ×4
GOWN STRL REUS W/TWL XL LVL3 (GOWN DISPOSABLE) ×4 IMPLANT
GRAFT TISS WHOLE PATELLA 20X60 IMPLANT
IMMOBILIZER KNEE 20 (SOFTGOODS) ×2
IMMOBILIZER KNEE 20 THIGH 36 (SOFTGOODS) IMPLANT
IMMOBILIZER KNEE 22 UNIV (SOFTGOODS) IMPLANT
KIT BASIN OR (CUSTOM PROCEDURE TRAY) ×2 IMPLANT
KNIFE GRAFT ACL 10MM 5952 (MISCELLANEOUS) IMPLANT
MANIFOLD NEPTUNE II (INSTRUMENTS) ×2 IMPLANT
NDL SAFETY ECLIPSE 18X1.5 (NEEDLE) ×1 IMPLANT
NEEDLE HYPO 18GX1.5 SHARP (NEEDLE) ×2
PACK ARTHROSCOPY DSU (CUSTOM PROCEDURE TRAY) ×2 IMPLANT
PASSER SUT SWANSON 36MM LOOP (INSTRUMENTS) IMPLANT
PATELLA LIGAMENT FR QUADRACEP ×2 IMPLANT
PROBE BIPOLAR ARTHRO 85MM 30D (MISCELLANEOUS) IMPLANT
SCREW EZ START 8X20 (Screw) ×1 IMPLANT
SCREW EZ START INTERFER 7 20 (Screw) ×1 IMPLANT
STRIP CLOSURE SKIN 1/2X4 (GAUZE/BANDAGES/DRESSINGS) ×2 IMPLANT
SUCTION FRAZIER HANDLE 10FR (MISCELLANEOUS)
SUCTION TUBE FRAZIER 10FR DISP (MISCELLANEOUS) IMPLANT
SUT BONE WAX W31G (SUTURE) IMPLANT
SUT MNCRL AB 3-0 PS2 18 (SUTURE) ×2 IMPLANT
SUT VIC AB 0 CT1 27 (SUTURE)
SUT VIC AB 0 CT1 27XBRD ANBCTR (SUTURE) IMPLANT
SUT VIC AB 2-0 CT1 27 (SUTURE) ×2
SUT VIC AB 2-0 CT1 TAPERPNT 27 (SUTURE) ×1 IMPLANT
SUT VIC AB 3-0 SH 27 (SUTURE)
SUT VIC AB 3-0 SH 27X BRD (SUTURE) IMPLANT
TOWEL OR 17X26 10 PK STRL BLUE (TOWEL DISPOSABLE) ×2 IMPLANT
TUBING ARTHROSCOPY IRRIG 16FT (MISCELLANEOUS) ×2 IMPLANT
WRAP KNEE MAXI GEL POST OP (GAUZE/BANDAGES/DRESSINGS) ×2 IMPLANT

## 2020-05-23 NOTE — Progress Notes (Signed)
Assisted Dr. Germeroth with left, ultrasound guided, femoral block. Side rails up, monitors on throughout procedure. See vital signs in flow sheet. Tolerated Procedure well. 

## 2020-05-23 NOTE — Anesthesia Preprocedure Evaluation (Addendum)
Anesthesia Evaluation  Patient identified by MRN, date of birth, ID band Patient awake    Reviewed: Allergy & Precautions, NPO status , Patient's Chart, lab work & pertinent test results  Airway Mallampati: II  TM Distance: >3 FB Neck ROM: Full    Dental  (+) Dental Advisory Given, Teeth Intact   Pulmonary asthma , pneumonia,    Pulmonary exam normal breath sounds clear to auscultation       Cardiovascular negative cardio ROS Normal cardiovascular exam Rhythm:Regular Rate:Normal     Neuro/Psych  Headaches,    GI/Hepatic negative GI ROS, Neg liver ROS,   Endo/Other  negative endocrine ROS  Renal/GU negative Renal ROS     Musculoskeletal negative musculoskeletal ROS (+)   Abdominal   Peds  Hematology negative hematology ROS (+)   Anesthesia Other Findings   Reproductive/Obstetrics                            Anesthesia Physical Anesthesia Plan  ASA: II  Anesthesia Plan: General   Post-op Pain Management: GA combined w/ Regional for post-op pain   Induction: Intravenous  PONV Risk Score and Plan: 4 or greater and Ondansetron, Dexamethasone, Midazolam, Scopolamine patch - Pre-op and Treatment may vary due to age or medical condition  Airway Management Planned: LMA  Additional Equipment: None  Intra-op Plan:   Post-operative Plan: Extubation in OR  Informed Consent: I have reviewed the patients History and Physical, chart, labs and discussed the procedure including the risks, benefits and alternatives for the proposed anesthesia with the patient or authorized representative who has indicated his/her understanding and acceptance.     Dental advisory given  Plan Discussed with: CRNA  Anesthesia Plan Comments:        Anesthesia Quick Evaluation

## 2020-05-23 NOTE — Transfer of Care (Signed)
Immediate Anesthesia Transfer of Care Note  Patient: Monica Mcgee  Procedure(s) Performed: LEFT KNEE ANTERIOR CRUCIATE LIGAMENT (ACL) RECONSTRUCTION AND PARTIAL MEDIAL MENISCECTOMY (Left Knee)  Patient Location: PACU  Anesthesia Type:General  Level of Consciousness: awake, alert , oriented and patient cooperative  Airway & Oxygen Therapy: Patient Spontanous Breathing and Patient connected to face mask  Post-op Assessment: Report given to RN and Post -op Vital signs reviewed and stable  Post vital signs: Reviewed and stable  Last Vitals:  Vitals Value Taken Time  BP 112/66 05/23/20 1248  Temp    Pulse 71 05/23/20 1253  Resp 18 05/23/20 1253  SpO2 100 % 05/23/20 1253  Vitals shown include unvalidated device data.  Last Pain:  Vitals:   05/23/20 1021  PainSc: 0-No pain      Patients Stated Pain Goal: 4 (21/19/41 7408)  Complications: No complications documented.

## 2020-05-23 NOTE — Brief Op Note (Signed)
  Monica Mcgee [id number] 05/23/2020   PRE-OP DIAGNOSIS: left ACL tear and TMM  POST-OP DIAGNOSIS: same  PROCEDURE: left ACL recon and PMM  ANESTHESIA: general and block  Hessie Dibble   Dictation #:  [id number]

## 2020-05-23 NOTE — Interval H&P Note (Signed)
History and Physical Interval Note:  05/23/2020 9:59 AM  Monica Mcgee  has presented today for surgery, with the diagnosis of Left knee anterior cruciate ligament tear and medial meniscus tear.  The various methods of treatment have been discussed with the patient and family. After consideration of risks, benefits and other options for treatment, the patient has consented to  Procedure(s): LEFT KNEE ANTERIOR CRUCIATE LIGAMENT (ACL) RECONSTRUCTION AND PARTIAL MEDIAL MENISCECTOMY (Left) as a surgical intervention.  The patient's history has been reviewed, patient examined, no change in status, stable for surgery.  I have reviewed the patient's chart and labs.  Questions were answered to the patient's satisfaction.     Hessie Dibble

## 2020-05-23 NOTE — Anesthesia Procedure Notes (Signed)
Anesthesia Regional Block: Femoral nerve block   Pre-Anesthetic Checklist: ,, timeout performed, Correct Patient, Correct Site, Correct Laterality, Correct Procedure, Correct Position, site marked, Risks and benefits discussed,  Surgical consent,  Pre-op evaluation,  At surgeon's request and post-op pain management  Laterality: Left  Prep: chloraprep       Needles:  Injection technique: Single-shot  Needle Type: Stimulator Needle - 80     Needle Length: 9cm  Needle Gauge: 22   Needle insertion depth: 6 cm   Additional Needles:   Procedures:,,,, ultrasound used (permanent image in chart),,,,  Narrative:  Start time: 05/23/2020 9:56 AM End time: 05/23/2020 10:16 AM Injection made incrementally with aspirations every 5 mL.  Performed by: Personally  Anesthesiologist: Nolon Nations, MD  Additional Notes: BP cuff, EKG monitors applied. Sedation begun. Femoral artery palpated for location of nerve. After nerve location verified with U/S, anesthetic injected incrementally, slowly, and after negative aspirations under direct u/s guidance. Good perineural spread. Patient tolerated well.

## 2020-05-23 NOTE — Anesthesia Procedure Notes (Signed)
Procedure Name: LMA Insertion Date/Time: 05/23/2020 11:16 AM Performed by: Claudia Desanctis, CRNA Pre-anesthesia Checklist: Emergency Drugs available, Patient identified, Suction available and Patient being monitored Patient Re-evaluated:Patient Re-evaluated prior to induction Oxygen Delivery Method: Circle system utilized Preoxygenation: Pre-oxygenation with 100% oxygen Induction Type: IV induction Ventilation: Mask ventilation without difficulty LMA: LMA inserted LMA Size: 4.0 Number of attempts: 1 Placement Confirmation: positive ETCO2 and breath sounds checked- equal and bilateral Tube secured with: Tape Dental Injury: Teeth and Oropharynx as per pre-operative assessment

## 2020-05-24 ENCOUNTER — Encounter (HOSPITAL_COMMUNITY): Payer: Self-pay | Admitting: Orthopaedic Surgery

## 2020-05-24 NOTE — Anesthesia Postprocedure Evaluation (Signed)
Anesthesia Post Note  Patient: Monica Mcgee  Procedure(s) Performed: LEFT KNEE ANTERIOR CRUCIATE LIGAMENT (ACL) RECONSTRUCTION AND PARTIAL MEDIAL MENISCECTOMY (Left Knee)     Patient location during evaluation: PACU Anesthesia Type: General Level of consciousness: sedated and patient cooperative Pain management: pain level controlled Vital Signs Assessment: post-procedure vital signs reviewed and stable Respiratory status: spontaneous breathing Cardiovascular status: stable Anesthetic complications: no   No complications documented.  Last Vitals:  Vitals:   05/23/20 1337 05/23/20 1345  BP: 122/77 109/79  Pulse: 74 67  Resp: 12 16  Temp:  (!) 36.3 C  SpO2: 100% 99%    Last Pain:  Vitals:   05/23/20 1345  PainSc: 2                  Nolon Nations

## 2020-05-24 NOTE — Op Note (Signed)
NAME: Monica Mcgee, Monica Mcgee MEDICAL RECORD NO: [id number] ACCOUNT NO: [id number] DATE OF BIRTH: 1960-07-21 FACILITY: [facility] LOCATION: [facility] PHYSICIAN: Monico Blitz. Rhona Raider, MD  Operative Report   DATE OF PROCEDURE: 05/23/2020  PREOPERATIVE DIAGNOSES:   1.  Left knee ACL tear. 2.  Left knee torn medial meniscus.  POSTOPERATIVE DIAGNOSES:   1.  Left knee ACL tear. 2.  Left knee torn medial meniscus.  PROCEDURE PERFORMED:   1.  Left knee ACL reconstruction. 2.  Left knee partial medial meniscectomy.  ANESTHESIA:  General and block.  ATTENDING SURGEON:  Monico Blitz. Rhona Raider, MD  ASSISTANT:  Loni Dolly, PA.  INDICATIONS FOR PROCEDURE:  The patient is a 60 year old woman with a long history of an unstable left knee.  She had a successful opposite knee ACL reconstruction many years back, but at this point is limited by instability about her left knee.  By MRI,  she has obvious absence of an ACL with perhaps a small meniscal tear.  She is limited in that she does not trust her knee, cannot do things she would like to do.  She is offered an Designer, multimedia.  Informed operative consent was obtained after  discussion of possible complications including reaction to anesthesia, infection and DVT.  The importance of the postoperative rehabilitation protocol was stressed extensively with the patient.  We had a discussion of various graft types and together  decided upon an allograft on patellar tendon.  SUMMARY OF FINDINGS AND PROCEDURE:  Under general anesthesia and a block, a left knee arthroscopy was performed.  Suprapatellar pouch was benign as was the patellofemoral joint.  Medial compartment exhibited a very small posterior horn medial meniscus  tear addressed with about a 5% partial medial meniscectomy done with the shaver alone.  She had no degenerative changes in this compartment.  ACL was completely torn and absent.  The PCL underneath was intact.  Lateral compartment exhibited no evidence  of  meniscal or articular cartilage injury.  We then performed ACL reconstruction with middle third patellar tendon bone-tendon-bone allograft material.  We used a transtibial technique with the Linvatec guides.  Loni Dolly, assisted throughout and was  invaluable to the completion of the case, mostly in that he fashioned the graft on the back table while I performed arthroscopic portions of the case, thereby significantly minimizing OR time.  DESCRIPTION OF PROCEDURE:  The patient was taken to the OR suite where general anesthetic was applied without difficulty.  She was also given a block in the preanesthesia area.  She was positioned supine and prepped and draped in normal sterile fashion.   After the administration of preoperative IV Kefzol and appropriate time-out, an arthroscopy of the left knee was performed through a total of 2 portals.  Findings were as noted above.  Procedure consisted of the brief partial medial meniscectomy,  followed by notchplasty with the bur.  I found the over the top position easily.  We then placed the guide into the knee just anterior to the PCL and placed the guidewire up into the knee in the appropriate position for a tibial tunnel.  This was then  overreamed to a diameter of 11 mm with a conical reamer.  I then placed the transtibial guide in the over the top position, utilized this to place a guidewire out the thigh proximally and laterally.  I then utilized this to ream with a conical reamer to  a depth of about 3 cm in diameter of 10 mm for femoral tunnel.  A stable 2 mm posterior wall was visualized and probed.  Bony debris was removed from the knee with a shaver.  While this was going on, Loni Dolly fashioned the graft on the back table to  fit through the appropriate tunnels.  Drill holes were placed in each of the bone plugs with the PDS suture placed in one and wire in the other.  At this point, this graft was pulled through the knee into the femoral tunnel.  Care  was taken to keep the  tendinous surface of the graft in a posterior direction as it entered the femoral tunnel.  We placed a guidewire anterior in this tunnel and placed a 7 x 20 metal Linvatec screw in interference fashion securing the leading bone plug in the femur.  The  knee was ranged and the graft was felt to be isometric.  We placed a guidewire then up into the knee through the tibial tunnel, which was seen to enter the knee arthroscopically.  Over that, we placed an 8 x 20 metal Linvatec screw securing the trailing  bone plug in the tibia.  Again, the knee ranged well.  She had a nice tight Lachman's at the end of the case.  The knee was irrigated, followed by removal of arthroscopic equipment.  We reapproximated the small incision on the anterior aspect of the knee  with some deep Vicryl, and subcuticular closure with Steri-Strips.  The portals were not sutured.  Adaptic was placed followed by dry gauze and a loose Ace wrap and knee immobilizer.  No tourniquet was placed or utilized during the case.  Estimated  blood loss and intraoperative fluids can be obtained from anesthesia records.  DISPOSITION:  The patient was extubated in the operating room and taken to recovery room in stable condition.  She wants to go home same day and follow up in the office in less than a week.  I will contact her by phone tonight.      PAA D: 05/23/2020 12:40:51 pm T: 05/24/2020 3:37:00 am  JOB: [id number]/ [id number]

## 2020-05-29 ENCOUNTER — Ambulatory Visit: Payer: 59 | Attending: Internal Medicine

## 2020-05-29 ENCOUNTER — Other Ambulatory Visit (HOSPITAL_BASED_OUTPATIENT_CLINIC_OR_DEPARTMENT_OTHER): Payer: Self-pay

## 2020-05-29 ENCOUNTER — Other Ambulatory Visit: Payer: Self-pay

## 2020-05-29 DIAGNOSIS — Z23 Encounter for immunization: Secondary | ICD-10-CM

## 2020-05-29 MED ORDER — PFIZER-BIONT COVID-19 VAC-TRIS 30 MCG/0.3ML IM SUSP
INTRAMUSCULAR | 0 refills | Status: AC
  Filled 2020-05-29: qty 0.3, 1d supply, fill #0

## 2020-05-29 NOTE — Progress Notes (Signed)
   Covid-19 Vaccination Clinic  Name:  TASHICA PROVENCIO    MRN: [id number] DOB: 22-May-1960  05/29/2020  Ms. Benedict was observed post Covid-19 immunization for 15 minutes without incident. She was provided with Vaccine Information Sheet and instruction to access the V-Safe system.   Ms. Ferdig was instructed to call 911 with any severe reactions post vaccine: Marland Kitchen Difficulty breathing  . Swelling of face and throat  . A fast heartbeat  . A bad rash all over body  . Dizziness and weakness   Immunizations Administered    Name Date Dose VIS Date Route   PFIZER Comrnaty(Gray TOP) Covid-19 Vaccine 05/29/2020 12:38 PM 0.3 mL 01/06/2020 Intramuscular   Manufacturer: [place]   Lot: [id number]   [place]: [phone]

## 2020-05-31 DIAGNOSIS — Z9889 Other specified postprocedural states: Secondary | ICD-10-CM | POA: Diagnosis not present

## 2020-06-02 DIAGNOSIS — S83512D Sprain of anterior cruciate ligament of left knee, subsequent encounter: Secondary | ICD-10-CM | POA: Diagnosis not present

## 2020-06-16 DIAGNOSIS — S83512D Sprain of anterior cruciate ligament of left knee, subsequent encounter: Secondary | ICD-10-CM | POA: Diagnosis not present

## 2020-06-27 ENCOUNTER — Other Ambulatory Visit (HOSPITAL_COMMUNITY): Payer: Self-pay

## 2020-06-27 DIAGNOSIS — H02423 Myogenic ptosis of bilateral eyelids: Secondary | ICD-10-CM | POA: Diagnosis not present

## 2020-06-27 DIAGNOSIS — H02834 Dermatochalasis of left upper eyelid: Secondary | ICD-10-CM | POA: Diagnosis not present

## 2020-06-27 DIAGNOSIS — H02831 Dermatochalasis of right upper eyelid: Secondary | ICD-10-CM | POA: Diagnosis not present

## 2020-06-27 MED ORDER — ERYTHROMYCIN 5 MG/GM OP OINT
TOPICAL_OINTMENT | OPHTHALMIC | 2 refills | Status: AC
  Filled 2020-06-27: qty 3.5, 7d supply, fill #0

## 2020-07-19 ENCOUNTER — Other Ambulatory Visit (HOSPITAL_COMMUNITY): Payer: Self-pay

## 2020-07-19 MED ORDER — SODIUM FLUORIDE 5000 PPM 1.1 % DT PSTE
PASTE | DENTAL | 4 refills | Status: AC
  Filled 2020-07-19: qty 100, 30d supply, fill #0

## 2020-07-28 ENCOUNTER — Other Ambulatory Visit (HOSPITAL_COMMUNITY): Payer: Self-pay

## 2022-03-02 IMAGING — MG MM DIGITAL DIAGNOSTIC UNILAT*L* W/ TOMO W/ CAD
6 series · 6 of 18 positions shown · non-contrast
Comparison: Previous exam(s).

CLINICAL DATA: Recall from screening mammography, possible one-view
asymmetry in the INNER LEFT breast visible only on the CC images.

EXAM:
DIGITAL DIAGNOSTIC UNILATERAL LEFT MAMMOGRAM WITH TOMOSYNTHESIS AND
CAD
TECHNIQUE: Left digital diagnostic mammography and breast tomosynthesis was
performed. The images were evaluated with computer-aided detection.

[L CC synth-2D (1 of 2)]
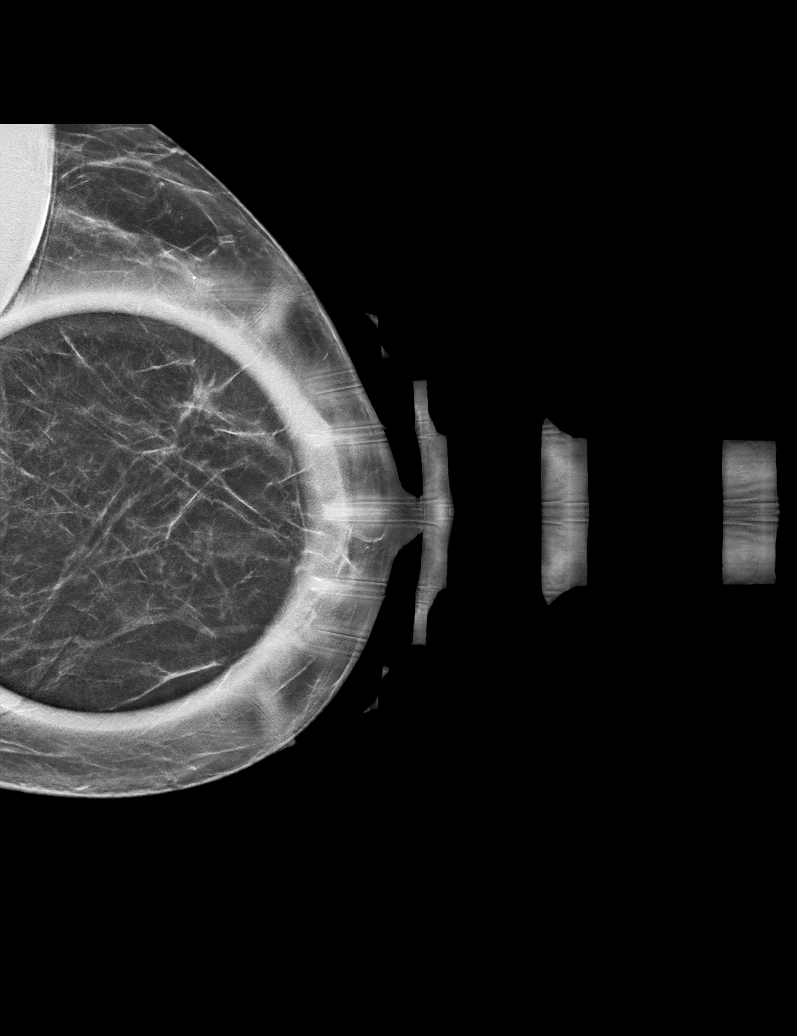

[L CC synth-2D (2 of 2)]
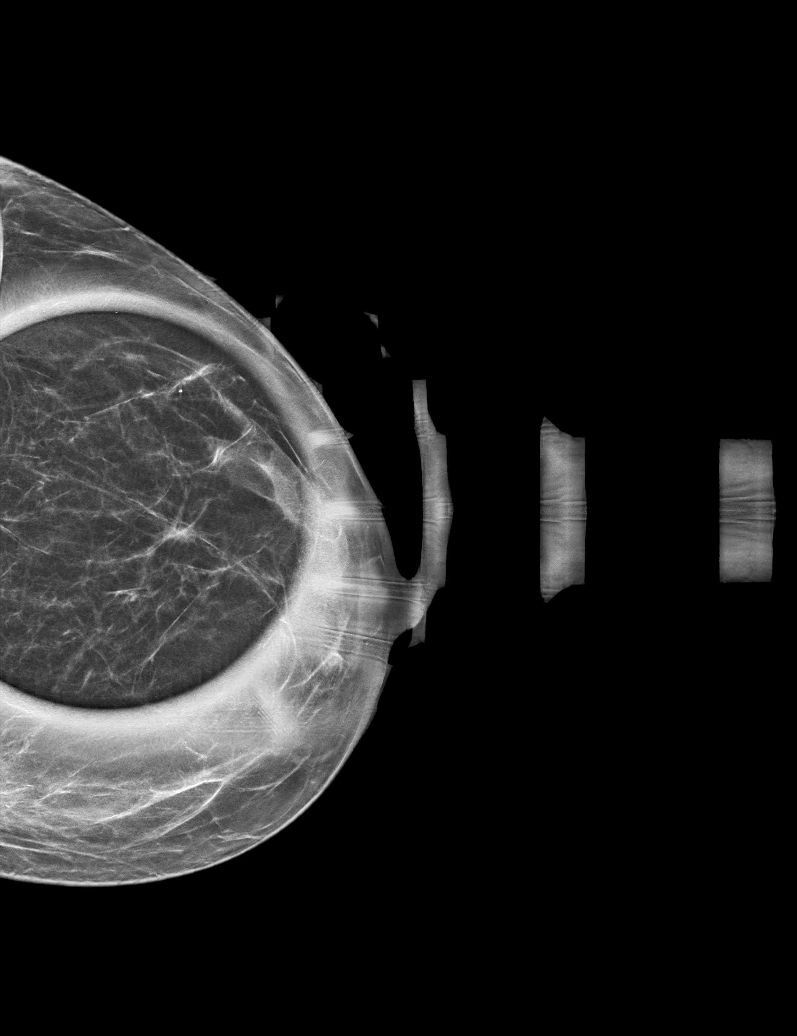

[L ML synth-2D]
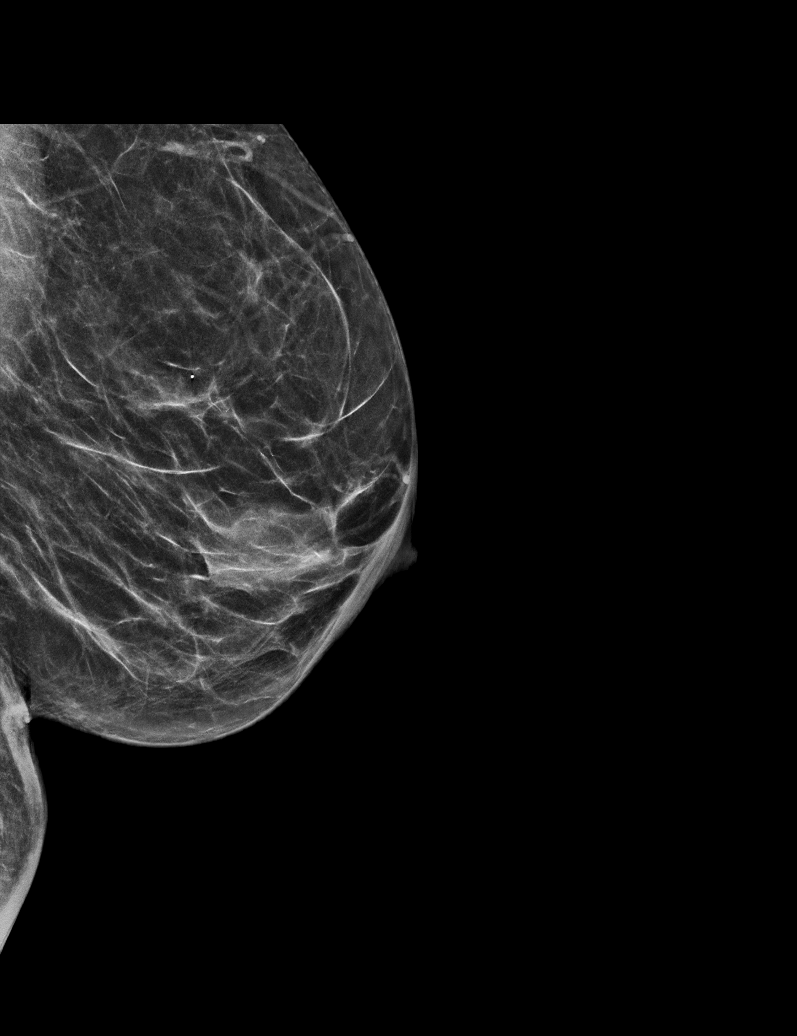

[L ML tomo · tomo slice 26/51.0]
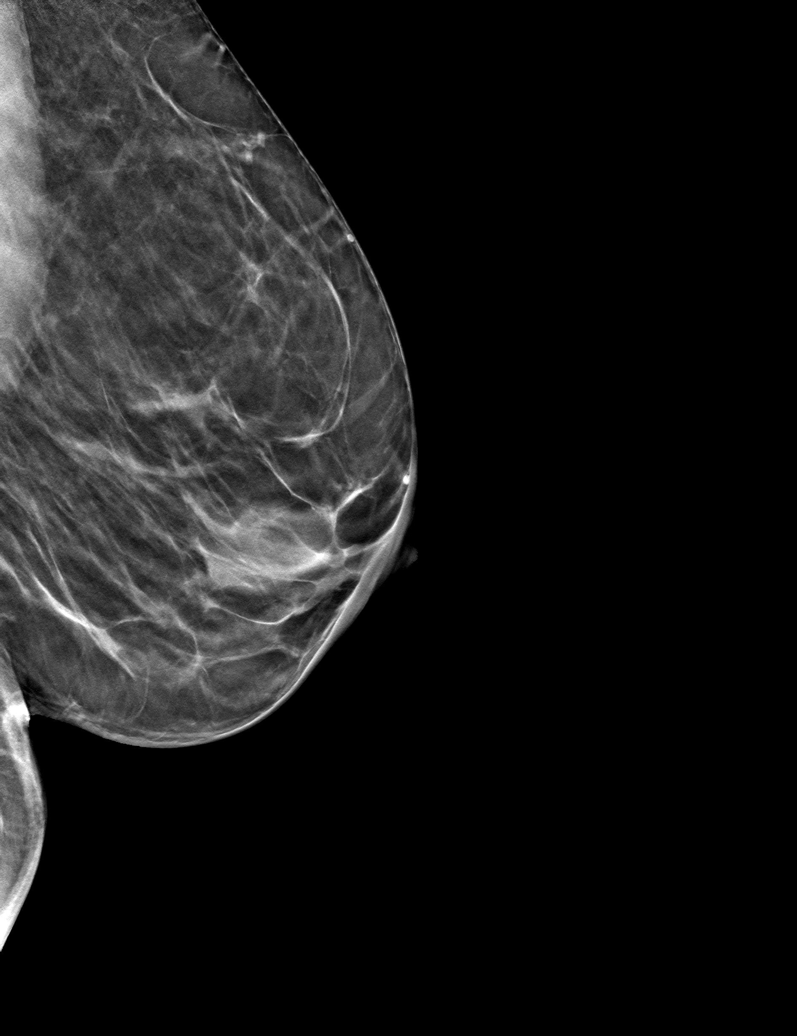

[L CC tomo (1 of 2) · tomo slice 24/47.0]
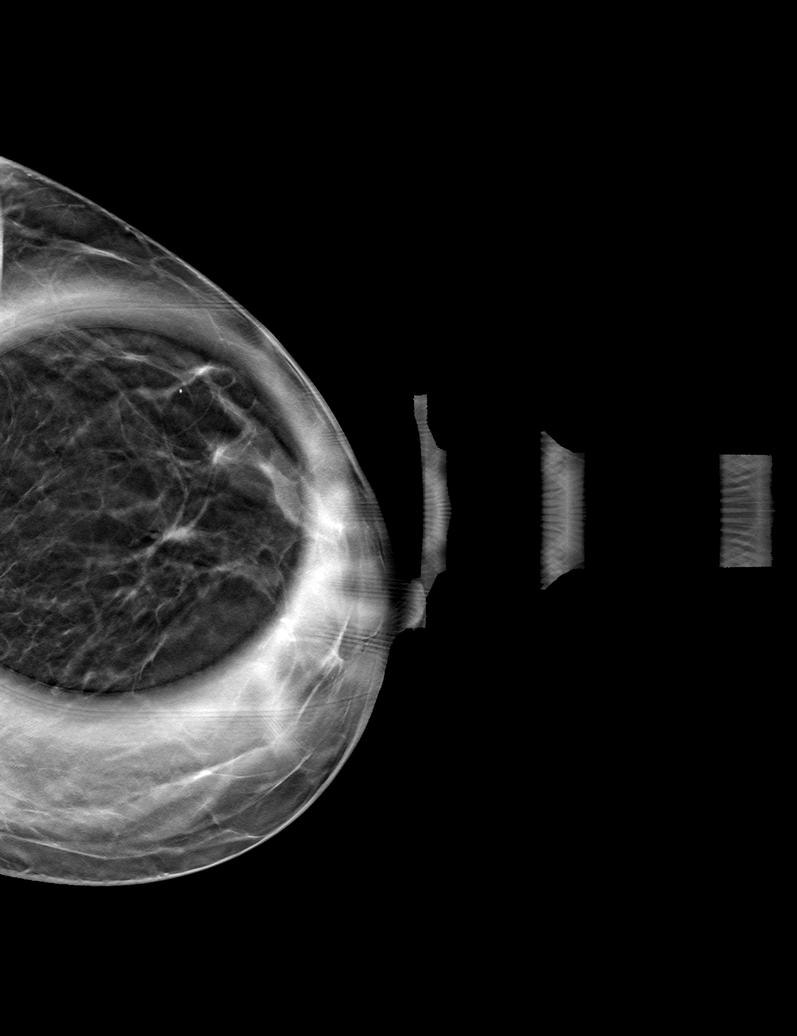

[L CC tomo (2 of 2) · tomo slice 24/47.0]
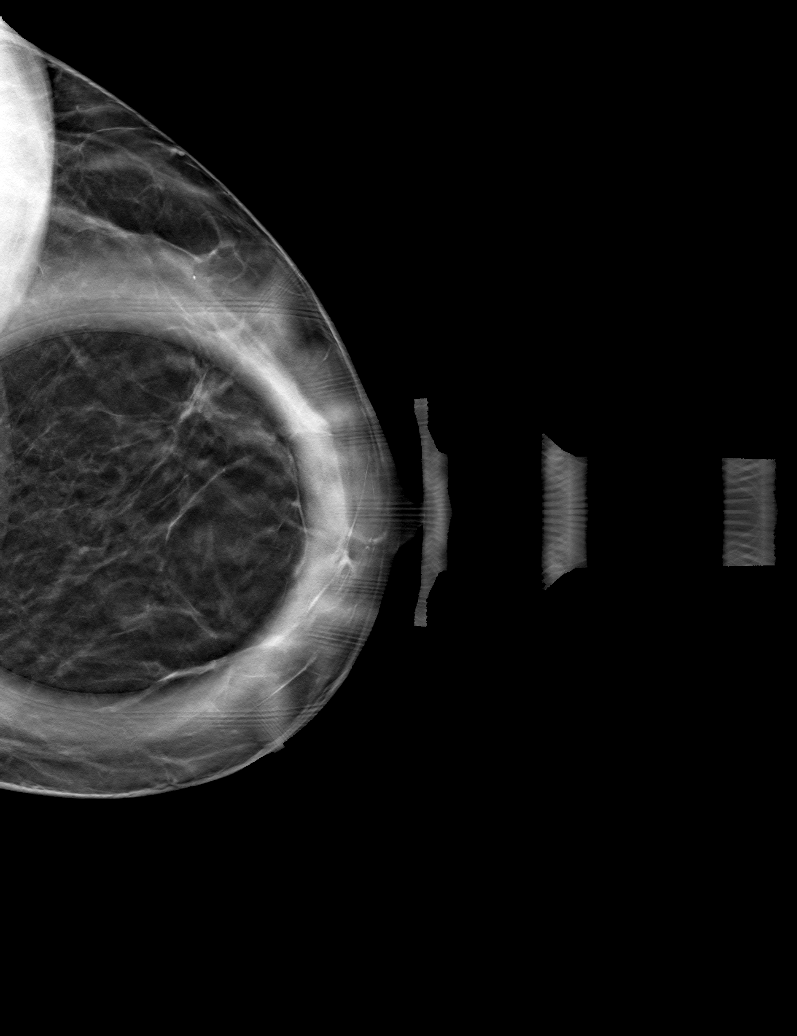

[6 of 18 positions shown; findings below may reference images not displayed]

ACR Breast Density Category b: There are scattered areas of
fibroglandular density.
FINDINGS: Spot-compression CC view of the area of concern and a full field
mediolateral view were obtained.

The asymmetry in the inner breast questioned on screening
mammography disperses with compression, indicating overlapping
fibroglandular tissue and Cooper's ligaments. There is no underlying
mass or architectural distortion.

No findings suspicious for malignancy.
IMPRESSION: No mammographic evidence of malignancy involving the LEFT breast.

RECOMMENDATION:
Screening mammogram in one year.(Code:X0-F-CEO)

I have discussed the findings and recommendations with the patient.
If applicable, a reminder letter will be sent to the patient
regarding the next appointment.

BI-RADS CATEGORY  1: Negative.
